# Patient Record
Sex: Female | Born: 1995
Health system: Southern US, Community
[De-identification: ages and names within clinical notes are randomized; demographics above are authoritative.]

## PROBLEM LIST (undated history)

## (undated) ENCOUNTER — Inpatient Hospital Stay (HOSPITAL_COMMUNITY): Payer: Self-pay

## (undated) DIAGNOSIS — R569 Unspecified convulsions: Secondary | ICD-10-CM

## (undated) DIAGNOSIS — Z973 Presence of spectacles and contact lenses: Secondary | ICD-10-CM

---
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
## 2013-03-06 ENCOUNTER — Encounter (HOSPITAL_BASED_OUTPATIENT_CLINIC_OR_DEPARTMENT_OTHER): Payer: Self-pay | Admitting: Emergency Medicine

## 2013-03-06 ENCOUNTER — Emergency Department (HOSPITAL_BASED_OUTPATIENT_CLINIC_OR_DEPARTMENT_OTHER)
Admission: EM | Admit: 2013-03-06 | Discharge: 2013-03-06 | Disposition: A | Discharge status: Home / Self Care | Payer: Medicaid Other | Attending: Emergency Medicine | Admitting: Emergency Medicine

## 2013-03-06 ENCOUNTER — Other Ambulatory Visit: Payer: Self-pay

## 2013-03-06 DIAGNOSIS — F410 Panic disorder [episodic paroxysmal anxiety] without agoraphobia: Secondary | ICD-10-CM | POA: Insufficient documentation

## 2013-03-06 DIAGNOSIS — G40909 Epilepsy, unspecified, not intractable, without status epilepticus: Secondary | ICD-10-CM | POA: Insufficient documentation

## 2013-03-06 DIAGNOSIS — Z79899 Other long term (current) drug therapy: Secondary | ICD-10-CM | POA: Insufficient documentation

## 2013-03-06 DIAGNOSIS — Z3202 Encounter for pregnancy test, result negative: Secondary | ICD-10-CM | POA: Insufficient documentation

## 2013-03-06 DIAGNOSIS — R002 Palpitations: Secondary | ICD-10-CM | POA: Insufficient documentation

## 2013-03-06 DIAGNOSIS — Z789 Other specified health status: Secondary | ICD-10-CM | POA: Insufficient documentation

## 2013-03-06 LAB — CBC WITH DIFFERENTIAL/PLATELET
Basophils Absolute: 0 10*3/uL (ref 0.0–0.1)
Eosinophils Relative: 4 % (ref 0–5)
HCT: 40.2 % (ref 36.0–46.0)
Lymphocytes Relative: 38 % (ref 12–46)
MCHC: 33.8 g/dL (ref 30.0–36.0)
MCV: 85.9 fL (ref 78.0–100.0)
Monocytes Absolute: 0.5 10*3/uL (ref 0.1–1.0)
Monocytes Relative: 11 % (ref 3–12)
RDW: 13.1 % (ref 11.5–15.5)
WBC: 4.5 10*3/uL (ref 4.0–10.5)

## 2013-03-06 LAB — BASIC METABOLIC PANEL
BUN: 10 mg/dL (ref 6–23)
CO2: 28 mEq/L (ref 19–32)
Calcium: 9.8 mg/dL (ref 8.4–10.5)
Creatinine, Ser: 0.9 mg/dL (ref 0.50–1.10)

## 2013-03-06 LAB — PREGNANCY, URINE: Preg Test, Ur: NEGATIVE

## 2013-03-06 NOTE — ED Notes (Signed)
Pt reports awoke form sleep and could feel heart pounding in chest and was unable to move extremities or speak  Denies LOC and was aware of events and surrounds at all times

## 2013-03-06 NOTE — ED Provider Notes (Signed)
History     CSN: [id number]  Arrival date & time 03/06/13  0413   First MD Initiated Contact with Patient 03/06/13 [phone]      Chief Complaint  Patient presents with  . Palpitations  . Panic Attack    (Consider location/radiation/quality/duration/timing/severity/associated sxs/prior treatment) HPI Comments: Patient presents with palpitations. She states that she woke up this evening with a feeling that her was racing. She states it lasted a few minutes and then resolve. She had no dizziness shortness of breath or chest pain associated with the palpitations. She states this has happened one time in the past. She denies any increased fatigue or exertional shortness of breath or chest pain. She denies any history of heart problems in the past. She denies a known history of panic attacks. She denies any drug use. She denies any leg swelling or calf tenderness.  Patient is a 17 y.o. female presenting with palpitations.  Palpitations Associated symptoms: no back pain, no chest pain, no cough, no diaphoresis, no dizziness, no nausea, no numbness, no shortness of breath and no vomiting     Past Medical History  Diagnosis Date  . Seizure   . Wears glasses     History reviewed. No pertinent past surgical history.  History reviewed. No pertinent family history.  History  Substance Use Topics  . Smoking status: Never Smoker   . Smokeless tobacco: Never Used  . Alcohol Use: No    OB History   Grav Para Term Preterm Abortions TAB SAB Ect Mult Living                  Review of Systems  Constitutional: Negative for fever, chills, diaphoresis and fatigue.  HENT: Negative for congestion, rhinorrhea and sneezing.   Eyes: Negative.   Respiratory: Negative for cough, chest tightness and shortness of breath.   Cardiovascular: Positive for palpitations. Negative for chest pain and leg swelling.  Gastrointestinal: Negative for nausea, vomiting, abdominal pain, diarrhea and blood in stool.   Genitourinary: Negative for frequency, hematuria, flank pain and difficulty urinating.  Musculoskeletal: Negative for back pain and arthralgias.  Skin: Negative for rash.  Neurological: Negative for dizziness, speech difficulty, weakness, numbness and headaches.    Allergies  Review of patient's allergies indicates no known allergies.  Home Medications   Current Outpatient Rx  Name  Route  Sig  Dispense  Refill  . LamoTRIgine (LAMICTAL XR) 200 MG TB24   Oral   Take 1 tablet by mouth daily.           . LamoTRIgine (LAMICTAL XR) 50 MG TB24   Oral   Take 1 tablet by mouth daily.           Marland Kitchen lamoTRIgine (LAMICTAL) 200 MG tablet   Oral   Take 250 mg by mouth daily.             BP 134/81  Pulse 70  Temp(Src) 98.2 F (36.8 C) (Oral)  Resp 16  SpO2 100%  Physical Exam  Constitutional: She is oriented to person, place, and time. She appears well-developed and well-nourished.  HENT:  Head: Normocephalic and atraumatic.  Eyes: Pupils are equal, round, and reactive to light.  Neck: Normal range of motion. Neck supple.  Cardiovascular: Normal rate, regular rhythm and normal heart sounds.   Pulmonary/Chest: Effort normal and breath sounds normal. No respiratory distress. She has no wheezes. She has no rales. She exhibits no tenderness.  Abdominal: Soft. Bowel sounds are normal. There is no  tenderness. There is no rebound and no guarding.  Musculoskeletal: Normal range of motion. She exhibits no edema.  Negative Homans sign  Lymphadenopathy:    She has no cervical adenopathy.  Neurological: She is alert and oriented to person, place, and time.  Skin: Skin is warm and dry. No rash noted.  Psychiatric: She has a normal mood and affect.    ED Course  Procedures (including critical care time)  Results for orders placed during the hospital encounter of 03/06/13  CBC WITH DIFFERENTIAL      Result Value Range   WBC 4.5  4.0 - 10.5 K/uL   RBC 4.68  3.87 - 5.11 MIL/uL    Hemoglobin 13.6  12.0 - 15.0 g/dL   HCT 40.9  81.1 - 91.4 %   MCV 85.9  78.0 - 100.0 fL   MCH 29.1  26.0 - 34.0 pg   MCHC 33.8  30.0 - 36.0 g/dL   RDW 78.2  95.6 - 21.3 %   Platelets 294  150 - 400 K/uL   Neutrophils Relative % 46  43 - 77 %   Neutro Abs 2.1  1.7 - 7.7 K/uL   Lymphocytes Relative 38  12 - 46 %   Lymphs Abs 1.7  0.7 - 4.0 K/uL   Monocytes Relative 11  3 - 12 %   Monocytes Absolute 0.5  0.1 - 1.0 K/uL   Eosinophils Relative 4  0 - 5 %   Eosinophils Absolute 0.2  0.0 - 0.7 K/uL   Basophils Relative 0  0 - 1 %   Basophils Absolute 0.0  0.0 - 0.1 K/uL  BASIC METABOLIC PANEL      Result Value Range   Sodium 139  135 - 145 mEq/L   Potassium 4.1  3.5 - 5.1 mEq/L   Chloride 102  96 - 112 mEq/L   CO2 28  19 - 32 mEq/L   Glucose, Bld 110 (*) 70 - 99 mg/dL   BUN 10  6 - 23 mg/dL   Creatinine, Ser 0.86  0.50 - 1.10 mg/dL   Calcium 9.8  8.4 - 57.8 mg/dL   GFR calc non Af Amer >90  >90 mL/min   GFR calc Af Amer >90  >90 mL/min  PREGNANCY, URINE      Result Value Range   Preg Test, Ur NEGATIVE  NEGATIVE   No results found.    Date: 03/06/2013  Rate: 69  Rhythm: normal sinus rhythm  QRS Axis: normal  Intervals: normal  ST/T Wave abnormalities: normal  Conduction Disutrbances:none  Narrative Interpretation:   Old EKG Reviewed: none available   1. Palpitations       MDM  Patient presents with palpitations. She denies any other associated symptoms. She has no evidence of anemia or significant electrolyte abnormality. She was discharged home in good condition with her mother. She was advised to followup with her primary care physician for recheck and if symptoms continue she may need a Holter monitor. She was advised to return here for symptoms worsen.        Rolan Bucco, MD 03/06/13 [phone]

## 2016-03-10 ENCOUNTER — Encounter (HOSPITAL_BASED_OUTPATIENT_CLINIC_OR_DEPARTMENT_OTHER): Payer: Self-pay | Admitting: *Deleted

## 2016-03-10 ENCOUNTER — Emergency Department (HOSPITAL_BASED_OUTPATIENT_CLINIC_OR_DEPARTMENT_OTHER)
Admission: EM | Admit: 2016-03-10 | Discharge: 2016-03-10 | Disposition: A | Discharge status: Home / Self Care | Payer: Medicaid Other | Attending: Emergency Medicine | Admitting: Emergency Medicine

## 2016-03-10 DIAGNOSIS — Z79899 Other long term (current) drug therapy: Secondary | ICD-10-CM | POA: Insufficient documentation

## 2016-03-10 DIAGNOSIS — H109 Unspecified conjunctivitis: Secondary | ICD-10-CM

## 2016-03-10 MED ORDER — FLUORESCEIN SODIUM 1 MG OP STRP
1.0000 | ORAL_STRIP | Freq: Once | OPHTHALMIC | Status: AC
  Administered 2016-03-10: 1 via OPHTHALMIC
  Filled 2016-03-10: qty 1

## 2016-03-10 MED ORDER — ERYTHROMYCIN 5 MG/GM OP OINT
1.0000 | TOPICAL_OINTMENT | Freq: Every day | OPHTHALMIC | Status: DC
Start: 2016-03-10 — End: 2016-12-22

## 2016-03-10 MED ORDER — TRIAMCINOLONE ACETONIDE 0.5 % EX CREA: 1.0000 "application " | TOPICAL_CREAM | Freq: Two times a day (BID) | CUTANEOUS | Status: DC

## 2016-03-10 MED ORDER — OLOPATADINE HCL 0.2 % OP SOLN: 1.0000 [drp] | Freq: Every day | OPHTHALMIC | Status: DC

## 2016-03-10 MED FILL — ERYTHROMYCIN EYE OINTMENT: 5 | 5 days supply | Qty: 4 | Fill #0

## 2016-03-10 MED FILL — OLOPATADINE HCL 0.1% EYE DR: 0.1 | 30 days supply | Qty: 5 | Fill #0

## 2016-03-10 NOTE — ED Notes (Signed)
C/o left eye redness, itching since 1 week ago. Feels like it is swollen.

## 2016-03-10 NOTE — ED Provider Notes (Signed)
CSN: [id number]     Arrival date & time 03/10/16  3086 History   First MD Initiated Contact with Patient 03/10/16 0729     No chief complaint on file.  HPI Taylor Colon is a 20 y.o. female  presenting with left eye redness. Patient states this started 1 week ago and has not seemed to get better. She denies any pain, even with movement of the eye, and says it is primarily itchy. There is some discharge and her eye is shut in the morning, but doesn't seem to have a lot of discharge throughout the day. The eye is primarily itchy. She occasionally gets a feeling of something in her eye. She denies any blurry vision. She has not used any drops in the eye, does not wear glasses or contacts. She denies any prior issue with the eye. Denies fever, current headache but gets headaches frequently, no nasal congestion, ear pain, sore throat, trouble swallowing, shortness of breath, chest pain, nausea vomiting, diarrhea constipation. She also complains of some left armpit rash that she has ahd in the past and went to dermatologist, has had a biopsy which didn't appear consistent with any specific diagnosis but did improve/resolve with a steroid cream. No family history of glaucoma or eye disease.  (Consider location/radiation/quality/duration/timing/severity/associated sxs/prior Treatment) Patient is a 20 y.o. female presenting with eye problem. The history is provided by the patient.  Eye Problem Location:  L eye Quality:  Foreign body sensation Severity:  Moderate Onset quality:  Gradual Duration:  7 days Timing:  Constant Progression:  Unchanged Chronicity:  New Context: not chemical exposure, not contact lens problem, not direct trauma, not foreign body and not scratch   Relieved by:  None tried Worsened by:  Nothing tried Ineffective treatments:  None tried Associated symptoms: crusting, discharge, foreign body sensation, headaches, itching, redness, swelling and tearing   Associated symptoms: no  blurred vision, no decreased vision, no double vision, no facial rash, no nausea, no photophobia and no vomiting     Past Medical History  Diagnosis Date  . Seizure (HCC)   . Wears glasses    History reviewed. No pertinent past surgical history. No family history on file. Social History  Substance Use Topics  . Smoking status: Never Smoker   . Smokeless tobacco: Never Used  . Alcohol Use: No   OB History    No data available     Review of Systems  Constitutional: Negative for fever.  HENT: Negative for ear discharge and ear pain.   Eyes: Positive for discharge, redness and itching. Negative for blurred vision, double vision, photophobia and pain.  Respiratory: Negative for shortness of breath.   Cardiovascular: Negative for chest pain.  Gastrointestinal: Negative for nausea, vomiting, diarrhea and constipation.  Genitourinary: Negative for urgency and frequency.  Musculoskeletal: Negative for joint swelling.  Neurological: Positive for headaches. Negative for seizures (last was 2-3 years ago).  All other systems reviewed and are negative.     Allergies  Review of patient's allergies indicates no known allergies.  Home Medications   Prior to Admission medications   Medication Sig Start Date End Date Taking? Authorizing Provider  LamoTRIgine (LAMICTAL XR) 200 MG TB24 Take 1 tablet by mouth daily. 300 mg   Yes Historical Provider, MD   BP 136/85 mmHg  Pulse 81  Temp(Src) 98.5 F (36.9 C) (Oral)  Resp 18  Ht  (1.575 m)  Wt 113.399 kg  BMI 45.71 kg/m2  SpO2 99%  LMP 02/08/2016 Physical  Exam  Constitutional: She is oriented to person, place, and time. She appears well-developed and well-nourished. No distress.  HENT:  Head: Normocephalic and atraumatic.  Eyes: EOM are normal. Pupils are equal, round, and reactive to light. Left eye exhibits discharge. Left eye exhibits no hordeolum. No foreign body present in the left eye. Right conjunctiva is not injected.  Right conjunctiva has no hemorrhage. Left conjunctiva is injected. Left conjunctiva has no hemorrhage.  Slit lamp exam:      The left eye shows no fluorescein uptake.  Limited/nondilated bedside fundoscopic exam is normal. No obvious hyphema or hypopyon.  Neck: Normal range of motion.  Cardiovascular: Normal rate, regular rhythm, normal heart sounds and intact distal pulses.   Pulmonary/Chest: Effort normal and breath sounds normal.  Lymphadenopathy:    She has no cervical adenopathy.  Neurological: She is alert and oriented to person, place, and time.  Skin: Skin is warm and dry.     Nursing note and vitals reviewed.   ED Course  Procedures (including critical care time) Labs Review Labs Reviewed - No data to display  Imaging Review No results found. I have personally reviewed and evaluated these images and lab results as part of my medical decision-making.   EKG Interpretation None      MDM   Final diagnoses:  Conjunctivitis of left eye   Fluorescein negative for uptake, no foreign objects seen. Most likely bacterial vs viral vs allergic conjunctivitis. Treat with erythromycin ointment and pataday drops, return if vision changes or eye becomes painful. For armpit rash rx triamcinolone cream and follow up with PCP/dermatologist if not resolving.   Nani RavensAndrew M Willies Laviolette, MD 03/10/16 [phone]  Melene Planan Floyd, DO 03/11/16 1118

## 2016-03-10 NOTE — Discharge Instructions (Signed)
Your eye has conjunctivitis, which could be due to a virus, bacteria, or allergies. There was no foreign body seen on the exam today, the feeling of something in your eye is most likely because the eye is irritated and dry. You can use over the counter lubricating drops as much as you want (good brands are Refresh and Systane).   Use the erythromycin ointment at night time right before bed (it will make your vision blurry for a short period so going to bed right after is best). You can use the pataday eye drop once a day to help with itching as needed. Don't use the pataday drops for more than a week   How to Use Eye Drops and Eye Ointments HOW TO APPLY EYE DROPS Follow these steps when applying eye drops: 1. Wash your hands. 2. Tilt your head back. 3. Put a finger under your eye and use it to gently pull your lower lid downward. Keep that finger in place. 4. Using your other hand, hold the dropper between your thumb and index finger. 5. Position the dropper just over the edge of the lower lid. Hold it as close to your eye as you can without touching the dropper to your eye. 6. Steady your hand. One way to do this is to lean your index finger against your brow. 7. Look up. 8. Slowly and gently squeeze one drop of medicine into your eye. 9. Close your eye. 10. Place a finger between your lower eyelid and your nose. Press gently for 2 minutes. This increases the amount of time that the medicine is exposed to the eye. It also reduces side effects that can develop if the drop gets into the bloodstream through the nose. HOW TO APPLY EYE OINTMENTS Follow these steps when applying eye ointments: 1. Wash your hands. 2. Put a finger under your eye and use it to gently pull your lower lid downward. Keep that finger in place. 3. Using your other hand, place the tip of the tube between your thumb and index finger with the remaining fingers braced against your cheek or nose. 4. Hold the tube just over the  edge of your lower lid without touching the tube to your lid or eyeball. 5. Look up. 6. Line the inner part of your lower lid with ointment. 7. Gently pull up on your upper lid and look down. This will force the ointment to spread over the surface of the eye. 8. Release the upper lid. 9. If you can, close your eyes for 1-2 minutes. Do not rub your eyes. If you applied the ointment correctly, your vision will be blurry for a few minutes. This is normal. ADDITIONAL INFORMATION  Make sure to use the eye drops or ointment as told by your health care provider.  If you have been told to use both eye drops and an eye ointment, apply the eye drops first, then wait 3-4 minutes before you apply the ointment.  Try not to touch the tip of the dropper or tube to your eye. A dropper or tube that has touched the eye can become contaminated.   This information is not intended to replace advice given to you by your health care provider. Make sure you discuss any questions you have with your health care provider.   Document Released: 12/27/2000 Document Revised: 02/04/2015 Document Reviewed: 09/16/2014 Elsevier Interactive Patient Education [employer]2016 [employer] Inc.

## 2016-05-12 DIAGNOSIS — G43009 Migraine without aura, not intractable, without status migrainosus: Secondary | ICD-10-CM | POA: Insufficient documentation

## 2016-07-29 ENCOUNTER — Encounter (HOSPITAL_BASED_OUTPATIENT_CLINIC_OR_DEPARTMENT_OTHER): Payer: Self-pay | Admitting: Emergency Medicine

## 2016-07-29 ENCOUNTER — Emergency Department (HOSPITAL_BASED_OUTPATIENT_CLINIC_OR_DEPARTMENT_OTHER): Payer: Self-pay

## 2016-07-29 ENCOUNTER — Emergency Department (HOSPITAL_BASED_OUTPATIENT_CLINIC_OR_DEPARTMENT_OTHER)
Admission: EM | Admit: 2016-07-29 | Discharge: 2016-07-29 | Disposition: A | Discharge status: Home / Self Care | Payer: Self-pay | Attending: Emergency Medicine | Admitting: Emergency Medicine

## 2016-07-29 DIAGNOSIS — L309 Dermatitis, unspecified: Secondary | ICD-10-CM | POA: Insufficient documentation

## 2016-07-29 DIAGNOSIS — H66001 Acute suppurative otitis media without spontaneous rupture of ear drum, right ear: Secondary | ICD-10-CM | POA: Insufficient documentation

## 2016-07-29 MED ORDER — HYDROCODONE-ACETAMINOPHEN 5-325 MG PO TABS: ORAL_TABLET | ORAL | 0 refills | Status: DC

## 2016-07-29 MED ORDER — AMOXICILLIN 500 MG PO CAPS: 1000.0000 mg | ORAL_CAPSULE | Freq: Two times a day (BID) | ORAL | 0 refills | Status: DC

## 2016-07-29 MED ORDER — BENZONATATE 100 MG PO CAPS
200.0000 mg | ORAL_CAPSULE | Freq: Once | ORAL | Status: AC
  Administered 2016-07-29: 200 mg via ORAL
  Filled 2016-07-29: qty 2

## 2016-07-29 MED FILL — AMOXICILLIN 500 MG CAPSULE: 500 | 10 days supply | Qty: 40 | Fill #0

## 2016-07-29 MED FILL — HYDROCODON-APAP 5-325: 5-325 | 1 days supply | Qty: 7 | Fill #0

## 2016-07-29 NOTE — ED Provider Notes (Signed)
[facility]   CSN: [id number] Arrival date & time: 07/29/16  0901     History   Chief Complaint Chief Complaint  Patient presents with  . Cough    HPI   Blood pressure 125/74, pulse 90, temperature 98.3 F (36.8 C), temperature source Oral, resp. rate 18, height 5\' 2"  (1.575 m), weight 113.4 kg, last menstrual period 07/04/2016, SpO2 98 %.  Taylor Colon is a 20 y.o. female complaining of Rhinorrhea, productive cough and right ear pain worsening over the course of 3 days. Patient denies chest pain, shortness of breath, fever, chills, headache, myalgia, sinus pressure pain, abdominal pain, nausea vomiting, change in bowel or bladder habits. She also notes an itchy rash which she's had for months, she's been using Aveeno over-the-counter with little relief.   Past Medical History:  Diagnosis Date  . Seizure (HCC)   . Wears glasses     There are no active problems to display for this patient.   History reviewed. No pertinent surgical history.  OB History    No data available       Home Medications    Prior to Admission medications   Medication Sig Start Date End Date Taking? Authorizing Provider  erythromycin [facility](ROMYCIN) ophthalmic ointment Place 1 application into the left eye at bedtime. Stop 2 days after red eye resolves 03/10/16   Nani RavensAndrew M Wight, MD  LamoTRIgine (LAMICTAL XR) 200 MG TB24 Take 1 tablet by mouth daily. 300 mg    Historical Provider, MD  Olopatadine HCl 0.2 % SOLN Apply 1 drop to eye daily. Stop after 2 weeks 03/10/16   Nani RavensAndrew M Wight, MD  triamcinolone cream (KENALOG) 0.5 % Apply 1 application topically 2 (two) times daily. 03/10/16   Nani RavensAndrew M Wight, MD    Family History History reviewed. No pertinent family history.  Social History Social History  Substance Use Topics  . Smoking status: Never Smoker  . Smokeless tobacco: Never Used  . Alcohol use No     Allergies   Review of patient's allergies indicates no known  allergies.   Review of Systems Review of Systems  10 systems reviewed and found to be negative, except as noted in the HPI.   Physical Exam Updated Vital Signs BP 125/74 (BP Location: Left Arm)   Pulse 90   Temp 98.3 F (36.8 C) (Oral)   Resp 18   Ht 5\' 2"  (1.575 m)   Wt 113.4 kg   LMP 07/04/2016   SpO2 98%   BMI 45.73 kg/m   Physical Exam  Constitutional: She appears well-developed and well-nourished.  HENT:  Head: Normocephalic.  Right Ear: External ear normal.  Left Ear: External ear normal.  Mouth/Throat: Oropharynx is clear and moist. No oropharyngeal exudate.  No drooling or stridor. Posterior pharynx mildly erythematous no significant tonsillar hypertrophy. No exudate. Soft palate rises symmetrically. No TTP or induration under tongue.   No tenderness to palpation of frontal or bilateral maxillary sinuses.  Mild mucosal edema in the nares with scant rhinorrhea.  Right tympanic membrane is erythematous and bulging with no light reflex.    Eyes: Conjunctivae and EOM are normal. Pupils are equal, round, and reactive to light.  Neck: Normal range of motion. Neck supple.  Cardiovascular: Normal rate and regular rhythm.   Pulmonary/Chest: Effort normal and breath sounds normal. No stridor. No respiratory distress. She has no wheezes. She has no rales. She exhibits no tenderness.  Abdominal: Soft. There is no tenderness. There is no  rebound and no guarding.  Skin: Rash noted.  Scaled ulcerations to bilateral elbows and both knees, lesions are blanchable, the spare the palms soles, mucous membranes.  Nursing note and vitals reviewed.    ED Treatments / Results  Labs (all labs ordered are listed, but only abnormal results are displayed) Labs Reviewed - No data to display  EKG  EKG Interpretation None       Radiology Dg Chest 2 View  Result Date: 07/29/2016 CLINICAL DATA:  Cough EXAM: CHEST  2 VIEW COMPARISON:  None. FINDINGS: The heart size and  mediastinal contours are within normal limits. Both lungs are clear. The visualized skeletal structures are unremarkable. IMPRESSION: No active cardiopulmonary disease. Electronically Signed   By: Marlan Palau M.D.   On: 07/29/2016 09:42    Procedures Procedures (including critical care time)  Medications Ordered in ED Medications  benzonatate (TESSALON) capsule 200 mg (not administered)     Initial Impression / Assessment and Plan / ED Course  I have reviewed the triage vital signs and the nursing notes.  Pertinent labs & imaging results that were available during my care of the patient were reviewed by me and considered in my medical decision making (see chart for details).  Clinical Course    Vitals:   07/29/16 0906  BP: 125/74  Pulse: 90  Resp: 18  Temp: 98.3 F (36.8 C)  TempSrc: Oral  SpO2: 98%  Weight: 113.4 kg  Height: 5\' 2"  (1.575 m)    Medications  benzonatate (TESSALON) capsule 200 mg (not administered)    Taylor Colon is 20 y.o. female presenting with URI-like symptoms including rhinorrhea, right ear pain, productive cough. Patient afebrile overall very well appearing. Lung sounds clear to auscultation bilaterally. Right TM erythematous and bulging, given her productive cough we'll obtain chest x-ray to evaluate for pneumonia.  Chest x-ray negative, patient would also like a persistent pruritic rash to extensor surfaces evaluated, lesions consistent with eczema, patient advised to apply Aquaphor to the areas multiple times a day and after showering. I have given her referral for primary care. Amoxicillin for otitis media, Vicodin for cough at night. Work note provided.  Evaluation does not show pathology that would require ongoing emergent intervention or inpatient treatment. Pt is hemodynamically stable and mentating appropriately. Discussed findings and plan with patient/guardian, who agrees with care plan. All questions answered. Return precautions discussed  and outpatient follow up given.      Final Clinical Impressions(s) / ED Diagnoses   Final diagnoses:  None    New Prescriptions New Prescriptions   No medications on file     Wynetta Emery, PA-C 07/29/16 1011    Maia Plan, MD 07/29/16 1038

## 2016-07-29 NOTE — Discharge Instructions (Signed)
Please follow with your primary care doctor in the next 2 days for a check-up. They must obtain records for further management.  ° °Do not hesitate to return to the Emergency Department for any new, worsening or concerning symptoms.  ° ° °Take your antibiotics as directed and to completion. You should never have any leftover antibiotics! Push fluids and stay well hydrated.  ° °Any antibiotic use can reduce the efficacy of hormonal birth control. Please use back up method of contraception.  ° ° °

## 2016-07-29 NOTE — ED Triage Notes (Signed)
Patient reports a cough x 3 days with right ear pain

## 2016-08-16 ENCOUNTER — Emergency Department (HOSPITAL_BASED_OUTPATIENT_CLINIC_OR_DEPARTMENT_OTHER)
Admission: EM | Admit: 2016-08-16 | Discharge: 2016-08-16 | Disposition: A | Discharge status: Home / Self Care | Payer: Medicaid Other | Attending: Emergency Medicine | Admitting: Emergency Medicine

## 2016-08-16 ENCOUNTER — Encounter (HOSPITAL_BASED_OUTPATIENT_CLINIC_OR_DEPARTMENT_OTHER): Payer: Self-pay | Admitting: Emergency Medicine

## 2016-08-16 DIAGNOSIS — K0889 Other specified disorders of teeth and supporting structures: Secondary | ICD-10-CM | POA: Insufficient documentation

## 2016-08-16 MED ORDER — IBUPROFEN 600 MG PO TABS: 600.0000 mg | ORAL_TABLET | Freq: Four times a day (QID) | ORAL | 0 refills | Status: DC | PRN

## 2016-08-16 MED ORDER — PENICILLIN V POTASSIUM 500 MG PO TABS: 500.0000 mg | ORAL_TABLET | Freq: Four times a day (QID) | ORAL | 0 refills | Status: AC

## 2016-08-16 MED FILL — IBUPROFEN 600 MG TABLET: 600 | 7 days supply | Qty: 30 | Fill #0

## 2016-08-16 MED FILL — PENICILLIN VK 500 MG TABLET: 500 | 7 days supply | Qty: 28 | Fill #0

## 2016-08-16 NOTE — Discharge Instructions (Signed)
You have a dental injury. It is very important that you get evaluated by a dentist as soon as possible. Call tomorrow to schedule an appointment. Ibuprofen as needed for pain. Take your full course of antibiotics. Read the instructions below. ° °Eat a soft or liquid diet and rinse your mouth out after meals with warm water. You should see a dentist or return here at once if you have increased swelling, increased pain or uncontrolled bleeding from the site of your injury. ° °SEEK MEDICAL CARE IF:  °You have increased pain not controlled with medicines.  °You have swelling around your tooth, in your face or neck.  °You have bleeding which starts, continues, or gets worse.  °You have a fever >101 °If you are unable to open your mouth °

## 2016-08-16 NOTE — ED Triage Notes (Signed)
Patient reports that she is having pain to the right side of her mouth. More painful after having her cavity filled.

## 2016-08-16 NOTE — ED Provider Notes (Signed)
[facility]   CSN: [id number] Arrival date & time: 08/16/16  1346     History   Chief Complaint Chief Complaint  Patient presents with  . Dental Pain    HPI Taylor Colon is a 20 y.o. female.  The history is provided by the patient and medical records. No language interpreter was used.  Dental Pain     Taylor Colon is a 20 y.o. female  with a PMH of seizures who presents to the Emergency Department complaining of worsening right lower dental pain. Patient states that she had a cavity filled 2 months ago and has been having persistent pain since that time. This pain acutely worsened over the last week. She has tried Orajel and ibuprofen with little relief. She denies difficulty swallowing, difficulty breathing, fevers, neck pain.  Past Medical History:  Diagnosis Date  . Seizure (HCC)   . Wears glasses     There are no active problems to display for this patient.   History reviewed. No pertinent surgical history.  OB History    No data available       Home Medications    Prior to Admission medications   Medication Sig Start Date End Date Taking? Authorizing Provider  amoxicillin (AMOXIL) 500 MG capsule Take 2 capsules (1,000 mg total) by mouth 2 (two) times daily. 07/29/16   Nicole Pisciotta, PA-C  erythromycin [facility](ROMYCIN) ophthalmic ointment Place 1 application into the left eye at bedtime. Stop 2 days after red eye resolves 03/10/16   Nani RavensAndrew M Wight, MD  HYDROcodone-acetaminophen (NORCO/VICODIN) 5-325 MG tablet Take 1-2 tablets by mouth every 6 hours as needed for pain and/or cough. 07/29/16   Nicole Pisciotta, PA-C  ibuprofen (ADVIL,MOTRIN) 600 MG tablet Take 1 tablet (600 mg total) by mouth every 6 (six) hours as needed. 08/16/16   Amos Gaber Pilcher Mazin Emma, PA-C  LamoTRIgine (LAMICTAL XR) 200 MG TB24 Take 1 tablet by mouth daily. 300 mg    Historical Provider, MD  Olopatadine HCl 0.2 % SOLN Apply 1 drop to eye daily. Stop after 2 weeks 03/10/16    Nani RavensAndrew M Wight, MD  penicillin v potassium (VEETID) 500 MG tablet Take 1 tablet (500 mg total) by mouth 4 (four) times daily. 08/16/16 08/23/16  Chase PicketJaime Pilcher Kameka Whan, PA-C  triamcinolone cream (KENALOG) 0.5 % Apply 1 application topically 2 (two) times daily. 03/10/16   Nani RavensAndrew M Wight, MD    Family History History reviewed. No pertinent family history.  Social History Social History  Substance Use Topics  . Smoking status: Never Smoker  . Smokeless tobacco: Never Used  . Alcohol use No     Allergies   Patient has no known allergies.   Review of Systems Review of Systems  Constitutional: Negative for fever.  HENT: Positive for dental problem. Negative for trouble swallowing.   Respiratory: Negative for shortness of breath.   Musculoskeletal: Negative for neck pain.     Physical Exam Updated Vital Signs BP 129/73 (BP Location: Left Arm)   Pulse 86   Temp 98.1 F (36.7 C) (Oral)   Resp 18   Ht 5\' 2"  (1.575 m)   Wt 99.8 kg   LMP 07/04/2016   SpO2 98%   BMI 40.24 kg/m   Physical Exam  Constitutional: She is oriented to person, place, and time. She appears well-developed and well-nourished. No distress.  HENT:  Head: Normocephalic and atraumatic.  Mouth/Throat:    Pain along tooth as depicted in image, midline uvula, no trismus, oropharynx  moist and clear, no abscess noted, no oropharyngeal erythema or edema, neck supple and no tenderness. No facial edema  Cardiovascular: Normal rate, regular rhythm and normal heart sounds.   No murmur heard. Pulmonary/Chest: Effort normal and breath sounds normal. No respiratory distress.  Musculoskeletal: Normal range of motion.  Neurological: She is alert and oriented to person, place, and time.  Skin: Skin is warm and dry.  Psychiatric: She has a normal mood and affect.  Nursing note and vitals reviewed.    ED Treatments / Results  Labs (all labs ordered are listed, but only abnormal results are displayed) Labs Reviewed -  No data to display  EKG  EKG Interpretation None       Radiology No results found.  Procedures Procedures (including critical care time)  Medications Ordered in ED Medications - No data to display   Initial Impression / Assessment and Plan / ED Course  I have reviewed the triage vital signs and the nursing notes.  Pertinent labs & imaging results that were available during my care of the patient were reviewed by me and considered in my medical decision making (see chart for details).  Clinical Course    Patient with dentalgia. No abscess requiring immediate incision and drainage. Patient is afebrile, non toxic appearing, and swallowing secretions well. Exam not concerning for Ludwig's angina or pharyngeal abscess. Will treat with PenVK and ibuprogen. Patient is followed by dentistry and I stressed the importance of dental follow up for ultimate management of dental pain. Patient voices understanding and is agreeable to plan.  Final Clinical Impressions(s) / ED Diagnoses   Final diagnoses:  Pain, dental    New Prescriptions New Prescriptions   IBUPROFEN (ADVIL,MOTRIN) 600 MG TABLET    Take 1 tablet (600 mg total) by mouth every 6 (six) hours as needed.   PENICILLIN V POTASSIUM (VEETID) 500 MG TABLET    Take 1 tablet (500 mg total) by mouth 4 (four) times daily.     Chase PicketJaime Pilcher Channa Hazelett, PA-C 08/16/16 1434    Shaune Pollackameron Isaacs, MD 08/16/16 [phone]

## 2016-12-22 ENCOUNTER — Encounter (HOSPITAL_BASED_OUTPATIENT_CLINIC_OR_DEPARTMENT_OTHER): Payer: Self-pay | Admitting: *Deleted

## 2016-12-22 ENCOUNTER — Emergency Department (HOSPITAL_BASED_OUTPATIENT_CLINIC_OR_DEPARTMENT_OTHER)
Admission: EM | Admit: 2016-12-22 | Discharge: 2016-12-22 | Disposition: A | Discharge status: Home / Self Care | Payer: Medicaid Other | Attending: Emergency Medicine | Admitting: Emergency Medicine

## 2016-12-22 DIAGNOSIS — K0889 Other specified disorders of teeth and supporting structures: Secondary | ICD-10-CM | POA: Diagnosis not present

## 2016-12-22 MED ORDER — AMOXICILLIN 500 MG PO CAPS: 500.0000 mg | ORAL_CAPSULE | Freq: Three times a day (TID) | ORAL | 0 refills | Status: DC

## 2016-12-22 MED FILL — AMOXICILLIN 500 MG CAPSULE: 500 | 7 days supply | Qty: 21 | Fill #0

## 2016-12-22 NOTE — ED Triage Notes (Signed)
Pt reports left lower tooth pain x Friday, denies fevers.

## 2016-12-22 NOTE — ED Provider Notes (Signed)
[facility]   CSN: [id number] Arrival date & time: 12/22/16  4098     History   Chief Complaint Chief Complaint  Patient presents with  . Dental Pain    HPI Taylor Colon is a 21 y.o. female.  HPI 21 year old Afro-American female with no significant past medical history presents to the ED today with complaints of toothache. Patient states that her pain started approximately 5 days ago and has gradually worsened. States she knows she has a chipped tooth but has not followed up with a dentist. She has tried over-the-counter Excedrin with little relief. She is also tender warm saltwater gargles with little relief. She denies any fever, chills, nausea, vomiting, facial swelling, difficulty swallowing, difficulty breathing. Past Medical History:  Diagnosis Date  . Seizure (HCC)   . Wears glasses     There are no active problems to display for this patient.   History reviewed. No pertinent surgical history.  OB History    No data available       Home Medications    Prior to Admission medications   Medication Sig Start Date End Date Taking? Authorizing Provider  amoxicillin (AMOXIL) 500 MG capsule Take 1 capsule (500 mg total) by mouth 3 (three) times daily. 12/22/16   Rise Mu, PA-C  LamoTRIgine (LAMICTAL XR) 200 MG TB24 Take 1 tablet by mouth daily. 300 mg    Historical Provider, MD    Family History History reviewed. No pertinent family history.  Social History Social History  Substance Use Topics  . Smoking status: Never Smoker  . Smokeless tobacco: Never Used  . Alcohol use No     Allergies   Patient has no known allergies.   Review of Systems Review of Systems  Constitutional: Negative for chills and fever.  HENT: Positive for dental problem. Negative for trouble swallowing.   Gastrointestinal: Negative for nausea and vomiting.     Physical Exam Updated Vital Signs BP 135/77 (BP Location: Right Arm)   Pulse 87    Temp 98.3 F (36.8 C) (Oral)   Resp 18   Ht 5\' 2"  (1.575 m)   Wt 96.6 kg   LMP 11/25/2016   SpO2 100%   BMI 38.96 kg/m   Physical Exam  Constitutional: She appears well-developed and well-nourished. No distress.  HENT:  Mouth/Throat: Uvula is midline, oropharynx is clear and moist and mucous membranes are normal.    No facial swelling. No sublingual or submandibular swelling. Oropharynx is clear. Managing secretions and maintaining her airway.  Eyes: Right eye exhibits no discharge. Left eye exhibits no discharge. No scleral icterus.  Pulmonary/Chest: No respiratory distress.  Musculoskeletal: Normal range of motion.  Neurological: She is alert.  Skin: Capillary refill takes less than 2 seconds. No pallor.  Nursing note and vitals reviewed.    ED Treatments / Results  Labs (all labs ordered are listed, but only abnormal results are displayed) Labs Reviewed - No data to display  EKG  EKG Interpretation None       Radiology No results found.  Procedures Procedures (including critical care time)  Medications Ordered in ED Medications - No data to display   Initial Impression / Assessment and Plan / ED Course  I have reviewed the triage vital signs and the nursing notes.  Pertinent labs & imaging results that were available during my care of the patient were reviewed by me and considered in my medical decision making (see chart for details).  Patient with toothache.  No gross abscess.  Exam unconcerning for Ludwig's angina or spread of infection.  The patient states there is a possibility that she could be pregnant. Encouraged use of Tylenol for pain. Encouraged avoiding NSAIDs. Will treat with amoxicillin. Discussed with pharmacy.Renae Gloss.  Urged patient to follow-up with dentist.  Dental resource guide given. Patient verbalized understanding the plan of care. All questions answered prior to discharge.   Final Clinical Impressions(s) / ED Diagnoses   Final  diagnoses:  Pain, dental    New Prescriptions New Prescriptions   AMOXICILLIN (AMOXIL) 500 MG CAPSULE    Take 1 capsule (500 mg total) by mouth 3 (three) times daily.     Rise MuKenneth T Lachele Lievanos, PA-C 12/22/16 0920    Geoffery Lyonsouglas Delo, MD 12/22/16 [phone]

## 2016-12-22 NOTE — ED Notes (Signed)
ED Provider at bedside. 

## 2016-12-22 NOTE — Discharge Instructions (Signed)
Please take antibiotics as prescribed. May take Tylenol for pain. Avoid NSAIDs. May apply Orajel over-the-counter to the affected tooth. You need to find dentist follow-up with. Return to the ED if she develops any worsening symptoms.

## 2016-12-31 MED FILL — ACETAMINOPHEN/COD #3 TABLET: 300-30 | 3 days supply | Qty: 20 | Fill #0

## 2016-12-31 MED FILL — AMOXICILLIN 500 MG CAPSULE: 500 | 7 days supply | Qty: 21 | Fill #0

## 2017-01-04 ENCOUNTER — Emergency Department (HOSPITAL_BASED_OUTPATIENT_CLINIC_OR_DEPARTMENT_OTHER)
Admission: EM | Admit: 2017-01-04 | Discharge: 2017-01-05 | Disposition: A | Discharge status: Home / Self Care | Payer: Medicaid Other | Attending: Emergency Medicine | Admitting: Emergency Medicine

## 2017-01-04 ENCOUNTER — Encounter (HOSPITAL_BASED_OUTPATIENT_CLINIC_OR_DEPARTMENT_OTHER): Payer: Self-pay

## 2017-01-04 DIAGNOSIS — Z87891 Personal history of nicotine dependence: Secondary | ICD-10-CM | POA: Insufficient documentation

## 2017-01-04 DIAGNOSIS — O26891 Other specified pregnancy related conditions, first trimester: Secondary | ICD-10-CM | POA: Diagnosis present

## 2017-01-04 DIAGNOSIS — R112 Nausea with vomiting, unspecified: Secondary | ICD-10-CM

## 2017-01-04 DIAGNOSIS — R197 Diarrhea, unspecified: Secondary | ICD-10-CM | POA: Insufficient documentation

## 2017-01-04 DIAGNOSIS — Z79899 Other long term (current) drug therapy: Secondary | ICD-10-CM | POA: Insufficient documentation

## 2017-01-04 DIAGNOSIS — Z3A11 11 weeks gestation of pregnancy: Secondary | ICD-10-CM | POA: Insufficient documentation

## 2017-01-04 DIAGNOSIS — O219 Vomiting of pregnancy, unspecified: Secondary | ICD-10-CM | POA: Diagnosis not present

## 2017-01-04 MED ORDER — SODIUM CHLORIDE 0.9 % IV BOLUS (SEPSIS)
1000.0000 mL | Freq: Once | INTRAVENOUS | Status: AC
  Administered 2017-01-05: 1000 mL via INTRAVENOUS

## 2017-01-04 MED ORDER — LOPERAMIDE HCL 2 MG PO CAPS
4.0000 mg | ORAL_CAPSULE | Freq: Once | ORAL | Status: AC
  Administered 2017-01-05: 4 mg via ORAL
  Filled 2017-01-04: qty 2

## 2017-01-04 MED ORDER — DICYCLOMINE HCL 10 MG/ML IM SOLN
20.0000 mg | Freq: Once | INTRAMUSCULAR | Status: AC
  Administered 2017-01-05: 20 mg via INTRAMUSCULAR
  Filled 2017-01-04: qty 2

## 2017-01-04 MED ORDER — ONDANSETRON HCL 4 MG/2ML IJ SOLN
4.0000 mg | Freq: Once | INTRAMUSCULAR | Status: DC
  Filled 2017-01-04: qty 2

## 2017-01-04 NOTE — ED Provider Notes (Addendum)
By signing my name below, I, Taylor Colon, attest that this documentation has been prepared under the direction and in the presence of Taylor Cassatt N Amsi Grimley, DO . Electronically Signed: Nelwyn Colon, Scribe. 01/04/2017. 11:54 PM.  TIME SEEN: 11:53 PM  CHIEF COMPLAINT: Abdominal Pain  HPI:  Taylor Colon is a 21 y.o. female with no pertinent pmhx who presents to the Emergency Department complaining of constant, mild abdominal pain onset 2 days. She describes her symptoms as a "tightness" localized to her right side. No modifying factors indicated. Pt reports associated vomiting, diarrhea and chills. No medications taken PTA. Denies any fever, dysuria, hematuria, vaginal bleeding or vaginal discharge. Pt is currently taking amoxicillin for an abscessed tooth. No abdominal shx. No sick contacts. No recent hospitalizations. No recent travel. Pt's LNMP was on 11/30/2016.   ROS: See HPI Constitutional: chills, no fever  Eyes: no drainage  ENT: no runny nose   Cardiovascular:  no chest pain  Resp: no SOB  GI: abdominal pain, vomiting. GU: no dysuria, no hematuria, no vaginal bleeding, no vaginal discharge Integumentary: no rash  Allergy: no hives  Musculoskeletal: no leg swelling  Neurological: no slurred speech ROS otherwise negative  PAST MEDICAL HISTORY/PAST SURGICAL HISTORY:  Past Medical History:  Diagnosis Date  . Seizure (HCC)   . Wears glasses     MEDICATIONS:  Prior to Admission medications   Medication Sig Start Date End Date Taking? Authorizing Provider  amoxicillin (AMOXIL) 500 MG capsule Take 1 capsule (500 mg total) by mouth 3 (three) times daily. 12/22/16  Yes Rise Mu, PA-C  LamoTRIgine (LAMICTAL XR) 200 MG TB24 Take 1 tablet by mouth daily. 300 mg    Historical Provider, MD    ALLERGIES:  No Known Allergies  SOCIAL HISTORY:  Social History  Substance Use Topics  . Smoking status: Former Smoker    Quit date: 10/04/2016  . Smokeless tobacco: Never Used  . Alcohol  use 1.8 oz/week    3 Standard drinks or equivalent per week     Comment: only on weekends    FAMILY HISTORY: No family history on file.  EXAM: BP 123/71 (BP Location: Right Arm)   Pulse (!) 106   Temp 98.6 F (37 C) (Oral)   Resp 20   Ht  (1.575 m)   Wt 220 lb (99.8 kg)   LMP 11/30/2016 (Exact Date)   SpO2 98%   BMI 40.24 kg/m  CONSTITUTIONAL: Alert and oriented and responds appropriately to questions. Well-appearing; well-nourished HEAD: Normocephalic EYES: Conjunctivae clear, pupils appear equal, EOMI ENT: normal nose; moist mucous membranes NECK: Supple, no meningismus, no nuchal rigidity, no LAD  CARD: RRR; S1 and S2 appreciated; no murmurs, no clicks, no rubs, no gallops RESP: Normal chest excursion without splinting or tachypnea; breath sounds clear and equal bilaterally; no wheezes, no rhonchi, no rales, no hypoxia or respiratory distress, speaking full sentences ABD/GI: Normal bowel sounds; non-distended; soft, minimally tender throughout right mid and upper abdomen, negative Murphy's sign, no significant tenderness to McBurney's, no rebound, no guarding, no peritoneal signs, no hepatosplenomegaly BACK:  The back appears normal and is non-tender to palpation, there is no CVA tenderness EXT: Normal ROM in all joints; non-tender to palpation; no edema; normal capillary refill; no cyanosis, no calf tenderness or swelling    SKIN: Normal color for age and race; warm; no rash NEURO: Moves all extremities equally PSYCH: The patient's mood and manner are appropriate. Grooming and personal hygiene are appropriate.  MEDICAL DECISION MAKING:  Patient here with nausea, vomiting or diarrhea. She is tender to palpation throughout the right side of her abdomen. Nonspecifically tender though at McBurney's point has a negative Murphy signs of makes appendicitis and cholecystitis less likely. We'll give IV fluids, Zofran, and Imodium for symptomatically relief. We'll obtain labs, urine.  At this time I do not feel she needs emergent imaging but will reassess frequently.  ED PROGRESS: Patient has a positive urine pregnancy test. She reports her last normal period was January 17 which would make her [redacted] weeks pregnant today. She states she did have 2 days of "spotting" on February 27. This was not normal for her to have such a short period.  She has never been pregnant before. Will add on a beta Quant hCG. Again she denies any vaginal bleeding, discharge, dysuria, hematuria. No significant lower abdominal pain. She did not know that she was pregnant. I have canceled patient's IV Zofran and we'll give IV Reglan instead.  2:30 AM  Pt reports feeling much better. Abdominal pain completely resolved. No further nausea, vomiting or diarrhea. Able to drink without difficulty. Labs here unremarkable. HCG is 102,376. Urine shows small ketones and she has received a liter of IV fluids. No other sign of urinary tract infection. I performed a bedside ultrasound which shows an intrauterine pregnancy with a crown-rump length of 3.4 cm and a fetal heart rate of 154 bpm. Normal fetal movement seen. There appears to be a corpus luteum of the right ovary. She has no tenderness over this area. I do not see any yolk sac, fetal pole, gestational sac to suggest that this would be a very rare heterotopic pregnancy. Again doubt appendicitis. Doubt cholecystitis. Negative Murphy sign on exam. Repeat abdominal exam completely benign. I have discussed with her she has any return of pain, vaginal bleeding or feel like she may pass our does pass out she needs to return to [facility] hospital immediately. We'll get outpatient OB/GYN follow-up and start her on prenatal vitamins. We'll discharge with prescription for Reglan for nausea. Have recommended Tylenol as needed for fever and pain. Discussed with her that she should avoid NSAIDs while pregnant.  At this time, I do not feel there is any life-threatening condition present. I  have reviewed and discussed all results (EKG, imaging, lab, urine as appropriate) and exam findings with patient/family. I have reviewed nursing notes and appropriate previous records.  I feel the patient is safe to be discharged home without further emergent workup and can continue workup as an outpatient as needed. Discussed usual and customary return precautions. Patient/family verbalize understanding and are comfortable with this plan.  Outpatient follow-up has been provided if needed. All questions have been answered.   EMERGENCY DEPARTMENT [place] PREGNANCY "Study: Limited Ultrasound of the Pelvis for Pregnancy"  INDICATIONS:Pregnancy(required) and Abdominal or pelvic pain Multiple views of the uterus and pelvic cavity were obtained in real-time with a multi-frequency probe.  APPROACH:Transabdominal  PERFORMED BY: Myself IMAGES ARCHIVED?: Yes LIMITATIONS: Body habitus PREGNANCY FREE FLUID: None ADNEXAL FINDINGS: Normal-appearing left ovary, likely corpus luteum of the right ovary Crown-rump length is 3.4 cm. Gestational age by dates is 11 weeks. FETAL HEART RATE: 154 INTERPRETATION: Intrauterine pregnancy with right-sided corpus luteum      I personally performed the services described in this documentation, which was scribed in my presence. The recorded information has been reviewed and is accurate.       Layla Maw Lianne Carreto, DO 01/05/17 [phone]

## 2017-01-04 NOTE — ED Triage Notes (Signed)
Pt states having RLQ abdominal pain with diarrhea and vomiting since last night.

## 2017-01-05 LAB — PREGNANCY, URINE: PREG TEST UR: POSITIVE — AB

## 2017-01-05 LAB — COMPREHENSIVE METABOLIC PANEL
ALBUMIN: 4 g/dL (ref 3.5–5.0)
ALT: 13 U/L — AB (ref 14–54)
AST: 15 U/L (ref 15–41)
Alkaline Phosphatase: 42 U/L (ref 38–126)
Anion gap: 8 (ref 5–15)
BUN: 9 mg/dL (ref 6–20)
CHLORIDE: 103 mmol/L (ref 101–111)
CO2: 23 mmol/L (ref 22–32)
Calcium: 9.2 mg/dL (ref 8.9–10.3)
Creatinine, Ser: 0.81 mg/dL (ref 0.44–1.00)
GFR calc Af Amer: >60 mL/min (ref >60)
GLUCOSE: 103 mg/dL — AB (ref 65–99)
POTASSIUM: 3.3 mmol/L — AB (ref 3.5–5.1)
SODIUM: 134 mmol/L — AB (ref 135–145)
Total Bilirubin: 0.3 mg/dL (ref 0.3–1.2)
Total Protein: 7.5 g/dL (ref 6.5–8.1)

## 2017-01-05 LAB — CBC WITH DIFFERENTIAL/PLATELET
BASOS ABS: 0 10*3/uL (ref 0.0–0.1)
BASOS PCT: 0 %
EOS ABS: 0.2 10*3/uL (ref 0.0–0.7)
EOS PCT: 3 %
HCT: 39.5 % (ref 36.0–46.0)
Hemoglobin: 13.8 g/dL (ref 12.0–15.0)
Lymphocytes Relative: 29 %
Lymphs Abs: 2 10*3/uL (ref 0.7–4.0)
MCH: 30.1 pg (ref 26.0–34.0)
MCHC: 34.9 g/dL (ref 30.0–36.0)
MCV: 86.2 fL (ref 78.0–100.0)
MONO ABS: 0.6 10*3/uL (ref 0.1–1.0)
Monocytes Relative: 9 %
Neutro Abs: 4 10*3/uL (ref 1.7–7.7)
Neutrophils Relative %: 59 %
PLATELETS: 310 10*3/uL (ref 150–400)
RBC: 4.58 MIL/uL (ref 3.87–5.11)
RDW: 13.2 % (ref 11.5–15.5)
WBC: 6.9 10*3/uL (ref 4.0–10.5)

## 2017-01-05 LAB — HCG, QUANTITATIVE, PREGNANCY: hCG, Beta Chain, Quant, S: 102376 m[IU]/mL — ABNORMAL HIGH (ref <5)

## 2017-01-05 LAB — URINALYSIS, ROUTINE W REFLEX MICROSCOPIC
Glucose, UA: NEGATIVE mg/dL
HGB URINE DIPSTICK: NEGATIVE
Ketones, ur: 15 mg/dL — AB
Leukocytes, UA: NEGATIVE
NITRITE: NEGATIVE
PROTEIN: NEGATIVE mg/dL
Specific Gravity, Urine: 1.031 — ABNORMAL HIGH (ref 1.005–1.030)
pH: 5.5 (ref 5.0–8.0)

## 2017-01-05 LAB — LIPASE, BLOOD: LIPASE: 14 U/L (ref 11–51)

## 2017-01-05 MED ORDER — METOCLOPRAMIDE HCL 10 MG PO TABS: 10.0000 mg | ORAL_TABLET | Freq: Four times a day (QID) | ORAL | 0 refills | Status: DC | PRN

## 2017-01-05 MED ORDER — METOCLOPRAMIDE HCL 5 MG/ML IJ SOLN
10.0000 mg | Freq: Once | INTRAMUSCULAR | Status: AC
  Administered 2017-01-05: 10 mg via INTRAVENOUS
  Filled 2017-01-05: qty 2

## 2017-01-05 MED ORDER — PRENATAL VITAMIN 27-0.8 MG PO TABS: 1.0000 | ORAL_TABLET | Freq: Every day | ORAL | 3 refills | Status: DC

## 2017-01-05 NOTE — ED Notes (Signed)
Pt presents with N/V/D since 01/03/17. Pt also reports abd cramping and tightness.

## 2017-01-05 NOTE — ED Notes (Signed)
Pt drank 8ozs of water and feel better. Pt denies nausea.

## 2017-01-05 NOTE — Discharge Instructions (Signed)
You may take Tylenol 1000 mg every 6 hours as needed for pain or fever. Please avoid NSAIDs such as ibuprofen, Motrin, Advil, aspirin, Goody powders, Aleve, naproxen as these are not safe in pregnancy. Please make an appointment for an OB/GYN. You likely have a viral gastroenteritis. If you began having lower pelvic pain, vaginal bleeding, feel like you might pass out or you do pass out she need to be seen by an OB/GYN immediately and [facility]  You are approximately [redacted] weeks pregnant based on your last menstrual period of January 17.

## 2017-01-13 ENCOUNTER — Encounter (HOSPITAL_BASED_OUTPATIENT_CLINIC_OR_DEPARTMENT_OTHER): Payer: Self-pay

## 2017-01-13 ENCOUNTER — Emergency Department (HOSPITAL_BASED_OUTPATIENT_CLINIC_OR_DEPARTMENT_OTHER)
Admission: EM | Admit: 2017-01-13 | Discharge: 2017-01-13 | Disposition: A | Discharge status: Home / Self Care | Payer: Medicaid Other | Attending: Dermatology | Admitting: Dermatology

## 2017-01-13 DIAGNOSIS — K0889 Other specified disorders of teeth and supporting structures: Secondary | ICD-10-CM | POA: Insufficient documentation

## 2017-01-13 DIAGNOSIS — Z5321 Procedure and treatment not carried out due to patient leaving prior to being seen by health care provider: Secondary | ICD-10-CM | POA: Insufficient documentation

## 2017-01-13 DIAGNOSIS — Z87891 Personal history of nicotine dependence: Secondary | ICD-10-CM | POA: Diagnosis not present

## 2017-01-13 NOTE — ED Triage Notes (Signed)
c/o right lower toothache-seen here and by dentist-completed abx 2 weeks ago-states has appt with dentist to have tooth pulled tomorrow-NAD-steady gait

## 2017-01-13 NOTE — ED Triage Notes (Signed)
Pt became angry when advised of wait stating she should have gone to her doctor-advised to let staff know if she decides to leave

## 2017-01-13 NOTE — ED Notes (Signed)
Pt seen leaving ED by registration staff.

## 2017-01-24 MED FILL — CITRANATAL HARMONY CAPSULE: 27-1-260 | 30 days supply | Qty: 30 | Fill #0

## 2017-04-11 MED FILL — TERCONAZOLE 80 MG SUPP: 80 | 3 days supply | Qty: 3 | Fill #0

## 2017-06-07 MED FILL — TERCONAZOLE 0.4% VAG CREAM: 0.4 | 7 days supply | Qty: 45 | Fill #0

## 2017-06-17 MED FILL — metroNIDAZOLE 500 MG TABS: 500 | 1 days supply | Qty: 4 | Fill #0

## 2017-06-17 MED FILL — PROMETHAZINE 25 MG TABLET: 25 | 8 days supply | Qty: 30 | Fill #0

## 2017-08-01 DIAGNOSIS — Z98891 History of uterine scar from previous surgery: Secondary | ICD-10-CM | POA: Insufficient documentation

## 2019-11-09 MED FILL — DIETHYLPROPION ER 75 MG TAB: 75 | 30 days supply | Qty: 30 | Fill #0

## 2019-11-20 MED FILL — metFORMIN HCL 500 MG TABS: 500 | 90 days supply | Qty: 180 | Fill #0

## 2019-12-11 MED FILL — DIETHYLPROPION ER 75 MG TAB: 75 | 30 days supply | Qty: 30 | Fill #0

## 2020-01-11 MED FILL — DIETHYLPROPION ER 75 MG TAB: 75 | 30 days supply | Qty: 30 | Fill #0

## 2020-02-18 MED FILL — DIETHYLPROPION ER 75 MG TAB: 75 | 30 days supply | Qty: 30 | Fill #0

## 2020-03-26 MED FILL — DIETHYLPROPION ER 75 MG TAB: 75 | 30 days supply | Qty: 30 | Fill #0

## 2020-04-16 MED FILL — DIETHYLPROPION ER 75 MG TAB: 75 | 30 days supply | Qty: 30 | Fill #0

## 2020-04-24 MED FILL — DIETHYLPROPION ER 75 MG TAB: 75 | 30 days supply | Qty: 30 | Fill #0

## 2020-06-18 MED FILL — DIETHYLPROPION ER 75 MG TAB: 75 | 30 days supply | Qty: 30 | Fill #0

## 2020-09-18 ENCOUNTER — Other Ambulatory Visit: Payer: Self-pay

## 2020-09-18 ENCOUNTER — Encounter (HOSPITAL_BASED_OUTPATIENT_CLINIC_OR_DEPARTMENT_OTHER): Payer: Self-pay

## 2020-09-18 ENCOUNTER — Emergency Department (HOSPITAL_BASED_OUTPATIENT_CLINIC_OR_DEPARTMENT_OTHER)
Admission: EM | Admit: 2020-09-18 | Discharge: 2020-09-18 | Disposition: A | Discharge status: Home / Self Care | Payer: Medicaid Other | Attending: Emergency Medicine | Admitting: Emergency Medicine

## 2020-09-18 ENCOUNTER — Emergency Department (HOSPITAL_BASED_OUTPATIENT_CLINIC_OR_DEPARTMENT_OTHER): Payer: Medicaid Other

## 2020-09-18 DIAGNOSIS — S99911A Unspecified injury of right ankle, initial encounter: Secondary | ICD-10-CM | POA: Diagnosis not present

## 2020-09-18 DIAGNOSIS — F17210 Nicotine dependence, cigarettes, uncomplicated: Secondary | ICD-10-CM | POA: Insufficient documentation

## 2020-09-18 DIAGNOSIS — W108XXA Fall (on) (from) other stairs and steps, initial encounter: Secondary | ICD-10-CM | POA: Diagnosis not present

## 2020-09-18 MED ORDER — IBUPROFEN 400 MG PO TABS
600.0000 mg | ORAL_TABLET | Freq: Once | ORAL | Status: AC
  Administered 2020-09-18: 600 mg via ORAL
  Filled 2020-09-18: qty 1

## 2020-09-18 NOTE — Discharge Instructions (Signed)
You have been seen today for an ankle injury. There were no acute abnormalities on the x-rays, including no sign of fracture or dislocation, however, there could be injuries to the soft tissues, such as the ligaments or tendons that are not seen on xrays. There could also be what are called occult fractures that are small fractures not seen on xray. °Antiinflammatory medications: Take 600 mg of ibuprofen every 6 hours or 440 mg (over the counter dose) to 500 mg (prescription dose) of naproxen every 12 hours for the next 3 days. After this time, these medications may be used as needed for pain. Take these medications with food to avoid upset stomach. Choose only one of these medications, do not take them together. °Acetaminophen (generic for Tylenol): Should you continue to have additional pain while taking the ibuprofen or naproxen, you may add in acetaminophen as needed. Your daily total maximum amount of acetaminophen from all sources should be limited to 4000mg/day for persons without liver problems, or 2000mg/day for those with liver problems. °Ice: May apply ice to the area over the next 24 hours for 15 minutes at a time to reduce swelling. °Elevation: Keep the extremity elevated as often as possible to reduce pain and inflammation. °Support: Wear the cam boot for support and comfort. Wear this until pain resolves. You will be weight-bearing as tolerated, which means you can slowly start to put weight on the extremity and increase amount and frequency as pain allows. °Follow up: If symptoms are improving, you may follow up with your primary care provider for any continued management. If symptoms are not starting to improve within a week, you should follow up with the orthopedic specialist within two weeks. °Return: Return to the ED for numbness, weakness, increasing pain, overall worsening symptoms, loss of function, or if symptoms are not improving, you have tried to follow up with the orthopedic specialist,  and have been unable to do so. ° °For prescription assistance, may try using prescription discount sites or apps, such as goodrx.com or Good Rx smart phone app. °

## 2020-09-18 NOTE — ED Triage Notes (Signed)
Pt reports right ankle injury 2 days ago-NAD-steady gait

## 2020-09-18 NOTE — ED Provider Notes (Signed)
[facility] EMERGENCY DEPARTMENT Provider Note   CSN: [id number] Arrival date & time: 09/18/20  1943     History Chief Complaint  Patient presents with  . Ankle Injury    Taylor Colon is a 24 y.o. female.  HPI      Taylor Colon is a 24 y.o. female, with a history of seizures and obesity, presenting to the ED with right ankle injury that occurred yesterday morning. Patient states she slipped on some stairs and injured her right ankle in the process.  Pain is medial and lateral, throbbing and aching, nonradiating, moderate.  She has not taken any medications for the pain.  She has been ambulatory since the incident.  Denies head injury, neck/back pain, numbness, weakness, other lower extremity pain, or any other complaints or injuries.  Past Medical History:  Diagnosis Date  . Seizure (HCC)   . Wears glasses     There are no problems to display for this patient.   Past Surgical History:  Procedure Laterality Date  . CESAREAN SECTION       OB History    Gravida  1   Para      Term      Preterm      AB      Living        SAB      IAB      Ectopic      Multiple      Live Births              No family history on file.  Social History   Tobacco Use  . Smoking status: Current Every Day Smoker    Types: E-cigarettes  . Smokeless tobacco: Never Used  Vaping Use  . Vaping Use: Every day  Substance Use Topics  . Alcohol use: Yes    Comment: occ  . Drug use: No    Home Medications Prior to Admission medications   Medication Sig Start Date End Date Taking? Authorizing Provider  LamoTRIgine (LAMICTAL XR) 200 MG TB24 Take 1 tablet by mouth daily. 300 mg    [provider]    Allergies    Patient has no known allergies.  Review of Systems   Review of Systems  Musculoskeletal: Positive for arthralgias and joint swelling. Negative for back pain and neck pain.  Neurological: Negative for weakness and numbness.     Physical Exam Updated Vital Signs BP 123/70 (BP Location: Right Arm)   Pulse 95   Temp 99.2 F (37.3 C) (Oral)   Resp 16   Ht 5\' 3"  (1.6 m)   Wt 119.7 kg   LMP 09/04/2020   SpO2 100%   Breastfeeding Unknown   BMI 46.77 kg/m   Physical Exam Vitals and nursing note reviewed.  Constitutional:      General: She is not in acute distress.    Appearance: She is well-developed and well-nourished. She is not diaphoretic.  HENT:     Head: Normocephalic and atraumatic.  Eyes:     Conjunctiva/sclera: Conjunctivae normal.  Cardiovascular:     Rate and Rhythm: Normal rate and regular rhythm.     Pulses:          Dorsalis pedis pulses are 2+ on the right side.       Posterior tibial pulses are 2+ on the right side.  Pulmonary:     Effort: Pulmonary effort is normal.  Musculoskeletal:     Cervical back: Neck supple.  Comments: Tenderness over the medial and lateral malleoli with mild swelling.  No noted deformity, instability.  Skin:    General: Skin is warm and dry.     Coloration: Skin is not pale.  Neurological:     Mental Status: She is alert.     Comments: Sensation to light touch grossly intact in the right foot and toes. Motor function intact in the right ankle and toes.  Psychiatric:        Mood and Affect: Mood and affect normal.        Behavior: Behavior normal.     ED Results / Procedures / Treatments   Labs (all labs ordered are listed, but only abnormal results are displayed) Labs Reviewed - No data to display  EKG None  Radiology DG Ankle Complete Right  Result Date: 09/18/2020 CLINICAL DATA:  Right ankle injury with pain EXAM: RIGHT ANKLE - COMPLETE 3+ VIEW COMPARISON:  None. FINDINGS: No fracture or malalignment. Ankle mortise is symmetric. There is mild soft tissue swelling IMPRESSION: Soft tissue swelling. No acute osseous abnormality. Electronically Signed   By: Jasmine Pang M.D.   On: 09/18/2020 20:14    Procedures Procedures (including  critical care time)  Medications Ordered in ED Medications  ibuprofen (ADVIL) tablet 600 mg (has no administration in time range)    ED Course  I have reviewed the triage vital signs and the nursing notes.  Pertinent labs & imaging results that were available during my care of the patient were reviewed by me and considered in my medical decision making (see chart for details).    MDM Rules/Calculators/A&P                          Patient presents with right ankle injury. I personally reviewed and interpreted the patient's x-ray. No osseous abnormality noted. Placed in a cam boot.  Patient declined crutches. The patient was given instructions for home care as well as return precautions. Patient voices understanding of these instructions, accepts the plan, and is comfortable with discharge.    Final Clinical Impression(s) / ED Diagnoses Final diagnoses:  Injury of right ankle, initial encounter    Rx / DC Orders ED Discharge Orders    None       Concepcion Living 09/18/20 2209    Milagros Loll, MD 09/20/20 [phone]

## 2020-10-08 ENCOUNTER — Telehealth: Payer: Self-pay | Admitting: Family Medicine

## 2020-10-08 NOTE — Telephone Encounter (Signed)
Called pt to offer ED f/u appt w/ Dr.Schmitz for :   Injury of right ankle,    --pt not available left message w/parent.  --glh

## 2021-02-26 ENCOUNTER — Other Ambulatory Visit (HOSPITAL_BASED_OUTPATIENT_CLINIC_OR_DEPARTMENT_OTHER): Payer: Self-pay

## 2021-03-12 ENCOUNTER — Other Ambulatory Visit (HOSPITAL_BASED_OUTPATIENT_CLINIC_OR_DEPARTMENT_OTHER): Payer: Self-pay

## 2021-03-12 MED ORDER — PHENTERMINE HCL 37.5 MG PO TABS
ORAL_TABLET | ORAL | 0 refills | Status: DC
  Filled 2021-03-12: qty 30, 30d supply, fill #0

## 2021-04-09 ENCOUNTER — Other Ambulatory Visit (HOSPITAL_BASED_OUTPATIENT_CLINIC_OR_DEPARTMENT_OTHER): Payer: Self-pay

## 2021-04-09 MED ORDER — PHENTERMINE HCL 37.5 MG PO TABS
ORAL_TABLET | ORAL | 0 refills | Status: DC
  Filled 2021-04-09: qty 30, 30d supply, fill #0

## 2021-05-04 ENCOUNTER — Other Ambulatory Visit (HOSPITAL_BASED_OUTPATIENT_CLINIC_OR_DEPARTMENT_OTHER): Payer: Self-pay

## 2021-05-04 MED ORDER — PHENTERMINE HCL 37.5 MG PO TABS
ORAL_TABLET | ORAL | 0 refills | Status: DC
  Filled 2021-05-04 – 2021-05-21 (×2): qty 30, 30d supply, fill #0

## 2021-05-11 ENCOUNTER — Other Ambulatory Visit (HOSPITAL_BASED_OUTPATIENT_CLINIC_OR_DEPARTMENT_OTHER): Payer: Self-pay

## 2021-05-13 ENCOUNTER — Other Ambulatory Visit (HOSPITAL_BASED_OUTPATIENT_CLINIC_OR_DEPARTMENT_OTHER): Payer: Self-pay

## 2021-05-21 ENCOUNTER — Other Ambulatory Visit (HOSPITAL_BASED_OUTPATIENT_CLINIC_OR_DEPARTMENT_OTHER): Payer: Self-pay

## 2021-06-04 ENCOUNTER — Other Ambulatory Visit (HOSPITAL_BASED_OUTPATIENT_CLINIC_OR_DEPARTMENT_OTHER): Payer: Self-pay

## 2021-06-04 MED ORDER — PHENTERMINE HCL 37.5 MG PO TABS
37.5000 mg | ORAL_TABLET | Freq: Every day | ORAL | 0 refills | Status: DC
  Filled 2021-06-04 – 2021-06-17 (×2): qty 30, 30d supply, fill #0

## 2021-06-17 ENCOUNTER — Other Ambulatory Visit (HOSPITAL_BASED_OUTPATIENT_CLINIC_OR_DEPARTMENT_OTHER): Payer: Self-pay

## 2021-07-03 ENCOUNTER — Other Ambulatory Visit (HOSPITAL_BASED_OUTPATIENT_CLINIC_OR_DEPARTMENT_OTHER): Payer: Self-pay

## 2021-07-03 MED ORDER — PHENTERMINE HCL 37.5 MG PO TABS
37.5000 mg | ORAL_TABLET | Freq: Every day | ORAL | 0 refills | Status: DC
  Filled 2021-07-03: qty 30, 30d supply, fill #0

## 2021-12-01 ENCOUNTER — Other Ambulatory Visit (HOSPITAL_BASED_OUTPATIENT_CLINIC_OR_DEPARTMENT_OTHER): Payer: Self-pay

## 2021-12-01 MED ORDER — METRONIDAZOLE 500 MG PO TABS
ORAL_TABLET | ORAL | 0 refills | Status: DC
  Filled 2021-12-01: qty 14, 7d supply, fill #0

## 2021-12-29 ENCOUNTER — Other Ambulatory Visit (HOSPITAL_BASED_OUTPATIENT_CLINIC_OR_DEPARTMENT_OTHER): Payer: Self-pay

## 2021-12-29 MED ORDER — TINIDAZOLE 500 MG PO TABS
1000.0000 mg | ORAL_TABLET | Freq: Every day | ORAL | 0 refills | Status: DC
  Filled 2021-12-29 – 2021-12-30 (×2): qty 10, 5d supply, fill #0

## 2021-12-30 ENCOUNTER — Other Ambulatory Visit (HOSPITAL_BASED_OUTPATIENT_CLINIC_OR_DEPARTMENT_OTHER): Payer: Self-pay

## 2022-01-15 ENCOUNTER — Emergency Department (HOSPITAL_BASED_OUTPATIENT_CLINIC_OR_DEPARTMENT_OTHER)
Admission: EM | Admit: 2022-01-15 | Discharge: 2022-01-15 | Disposition: A | Discharge status: Home / Self Care | Payer: Medicaid Other | Attending: Emergency Medicine | Admitting: Emergency Medicine

## 2022-01-15 ENCOUNTER — Encounter (HOSPITAL_BASED_OUTPATIENT_CLINIC_OR_DEPARTMENT_OTHER): Payer: Self-pay

## 2022-01-15 ENCOUNTER — Other Ambulatory Visit (HOSPITAL_BASED_OUTPATIENT_CLINIC_OR_DEPARTMENT_OTHER): Payer: Self-pay

## 2022-01-15 ENCOUNTER — Other Ambulatory Visit: Payer: Self-pay

## 2022-01-15 ENCOUNTER — Emergency Department (HOSPITAL_BASED_OUTPATIENT_CLINIC_OR_DEPARTMENT_OTHER): Payer: Medicaid Other

## 2022-01-15 DIAGNOSIS — D72829 Elevated white blood cell count, unspecified: Secondary | ICD-10-CM | POA: Insufficient documentation

## 2022-01-15 DIAGNOSIS — J36 Peritonsillar abscess: Secondary | ICD-10-CM | POA: Insufficient documentation

## 2022-01-15 DIAGNOSIS — Z3201 Encounter for pregnancy test, result positive: Secondary | ICD-10-CM

## 2022-01-15 DIAGNOSIS — O99511 Diseases of the respiratory system complicating pregnancy, first trimester: Secondary | ICD-10-CM | POA: Insufficient documentation

## 2022-01-15 DIAGNOSIS — Z20822 Contact with and (suspected) exposure to covid-19: Secondary | ICD-10-CM | POA: Insufficient documentation

## 2022-01-15 LAB — RESP PANEL BY RT-PCR (FLU A&B, COVID) ARPGX2
Influenza A by PCR: NEGATIVE
Influenza B by PCR: NEGATIVE
SARS Coronavirus 2 by RT PCR: NEGATIVE

## 2022-01-15 LAB — BASIC METABOLIC PANEL
Anion gap: 7 (ref 5–15)
BUN: 5 mg/dL — ABNORMAL LOW (ref 6–20)
CO2: 23 mmol/L (ref 22–32)
Calcium: 9 mg/dL (ref 8.9–10.3)
Chloride: 105 mmol/L (ref 98–111)
Creatinine, Ser: 0.91 mg/dL (ref 0.44–1.00)
GFR, Estimated: >60 mL/min (ref >60)
Glucose, Bld: 115 mg/dL — ABNORMAL HIGH (ref 70–99)
Potassium: 3.7 mmol/L (ref 3.5–5.1)
Sodium: 135 mmol/L (ref 135–145)

## 2022-01-15 LAB — CBC WITH DIFFERENTIAL/PLATELET
Abs Immature Granulocytes: 0.04 10*3/uL (ref 0.00–0.07)
Basophils Absolute: 0 10*3/uL (ref 0.0–0.1)
Basophils Relative: 0 %
Eosinophils Absolute: 0 10*3/uL (ref 0.0–0.5)
Eosinophils Relative: 0 %
HCT: 40 % (ref 36.0–46.0)
Hemoglobin: 13.3 g/dL (ref 12.0–15.0)
Immature Granulocytes: 0 %
Lymphocytes Relative: 8 %
Lymphs Abs: 0.9 10*3/uL (ref 0.7–4.0)
MCH: 28.1 pg (ref 26.0–34.0)
MCHC: 33.3 g/dL (ref 30.0–36.0)
MCV: 84.4 fL (ref 80.0–100.0)
Monocytes Absolute: 0.8 10*3/uL (ref 0.1–1.0)
Monocytes Relative: 7 %
Neutro Abs: 10 10*3/uL — ABNORMAL HIGH (ref 1.7–7.7)
Neutrophils Relative %: 85 %
Platelets: 250 10*3/uL (ref 150–400)
RBC: 4.74 MIL/uL (ref 3.87–5.11)
RDW: 13.7 % (ref 11.5–15.5)
WBC: 11.8 10*3/uL — ABNORMAL HIGH (ref 4.0–10.5)
nRBC: 0 % (ref 0.0–0.2)

## 2022-01-15 LAB — GROUP A STREP BY PCR: Group A Strep by PCR: NOT DETECTED

## 2022-01-15 LAB — HCG, QUANTITATIVE, PREGNANCY: hCG, Beta Chain, Quant, S: 2315 m[IU]/mL — ABNORMAL HIGH (ref <5)

## 2022-01-15 MED ORDER — AMOXICILLIN-POT CLAVULANATE 875-125 MG PO TABS
1.0000 | ORAL_TABLET | Freq: Two times a day (BID) | ORAL | 0 refills | Status: DC
  Filled 2022-01-15: qty 28, 14d supply, fill #0

## 2022-01-15 MED ORDER — CLINDAMYCIN PHOSPHATE 600 MG/50ML IV SOLN
600.0000 mg | Freq: Once | INTRAVENOUS | Status: AC
  Administered 2022-01-15: 600 mg via INTRAVENOUS
  Filled 2022-01-15: qty 50

## 2022-01-15 MED ORDER — IOHEXOL 300 MG/ML  SOLN
100.0000 mL | Freq: Once | INTRAMUSCULAR | Status: AC | PRN
  Administered 2022-01-15: 75 mL via INTRAVENOUS

## 2022-01-15 MED ORDER — DEXAMETHASONE SODIUM PHOSPHATE 10 MG/ML IJ SOLN
10.0000 mg | Freq: Once | INTRAMUSCULAR | Status: AC
  Administered 2022-01-15: 10 mg via INTRAVENOUS
  Filled 2022-01-15: qty 1

## 2022-01-15 MED ORDER — LACTATED RINGERS IV BOLUS: 1000.0000 mL | Freq: Once | INTRAVENOUS | Status: DC

## 2022-01-15 MED ORDER — SODIUM CHLORIDE 0.9 % IV SOLN
2.0000 g | Freq: Once | INTRAVENOUS | Status: AC
  Administered 2022-01-15: 2 g via INTRAVENOUS
  Filled 2022-01-15: qty 20

## 2022-01-15 MED ORDER — SODIUM CHLORIDE 0.9 % IV BOLUS
1000.0000 mL | Freq: Once | INTRAVENOUS | Status: AC
  Administered 2022-01-15: 1000 mL via INTRAVENOUS

## 2022-01-15 MED ORDER — CLINDAMYCIN HCL 300 MG PO CAPS
300.0000 mg | ORAL_CAPSULE | Freq: Three times a day (TID) | ORAL | 0 refills | Status: AC
  Filled 2022-01-15: qty 30, 10d supply, fill #0

## 2022-01-15 NOTE — ED Notes (Signed)
Patient transported to CT 

## 2022-01-15 NOTE — Discharge Instructions (Addendum)
Your strep, COVID and flu were all negative today.,  However your CT scan shows that you do have a small possibly early abscess in the right tonsil.  We have given you some IV antibiotics here in the emergency department along with a shot of steroid that should stay in your system for a few days.  Please drink lots and lots of water to help flush out infection.  I have sent you home with antibiotic that you need to take 3 times daily for 10 days.  Please follow-up with the ear nose and throat specialist after the course of antibiotics for reevaluation. ? ?Additionally, your pregnancy test was positive today.  Please follow-up with your OB/GYN ?

## 2022-01-15 NOTE — ED Provider Notes (Signed)
?[facility] EMERGENCY DEPARTMENT ?Provider Note ? ? ?CSN: [id number] ?Arrival date & time: 01/15/22  1143 ? ?  ? ?History ? ?Chief Complaint  ?Patient presents with  ? Sore Throat  ? ? ?Taylor Colon is a 26 y.o. female who presents to the ED for evaluation of sore throat, fever chills and body aches for 3 days.  Patient notes sore throat significantly worsened since last night with pain worse on the right side.  Also endorses muffled voice and is having difficulty swallowing.  She took some Motrin and Tylenol without significant improvement in pain, and she believes pain is worsening over time instead of improving.  She denies cough, congestion, back pain, abdominal pain, nausea, vomiting and diarrhea. ? ? ?Sore Throat ? ? ?  ? ?Home Medications ?Prior to Admission medications   ?Medication Sig Start Date End Date Taking? Authorizing Provider  ?clindamycin (CLEOCIN) 300 MG capsule Take 1 capsule (300 mg total) by mouth 3 (three) times daily for 10 days. 01/15/22 01/25/22 Yes Tonye Pearson, PA-C  ?LamoTRIgine (LAMICTAL XR) 200 MG TB24 Take 1 tablet by mouth daily. 300 mg    [provider]  ?metroNIDAZOLE (FLAGYL) 500 MG tablet Take 1 tablet (500 mg total) by mouth 2 times daily for 7 days. 12/01/21     ?phentermine (ADIPEX-P) 37.5 MG tablet Take 1 tablet (37.5 mg total) by mouth daily. 07/03/21     ?tinidazole (TINDAMAX) 500 MG tablet Take 2 tablets (1,000 mg total) by mouth daily for 5 days. 12/29/21     ?   ? ?Allergies    ?Patient has no known allergies.   ? ?Review of Systems   ?Review of Systems ? ?Physical Exam ?Updated Vital Signs ?BP 114/73   Pulse (!) 114   Temp (!) 100.6 ?F (38.1 ?C) (Oral)   Resp 18   Ht 5\' 4"  (1.626 m)   Wt 115.7 kg   LMP 12/17/2021   SpO2 100%   BMI 43.77 kg/m?  ?Physical Exam ? ?ED Results / Procedures / Treatments   ?Labs ?(all labs ordered are listed, but only abnormal results are displayed) ?Labs Reviewed  ?BASIC METABOLIC PANEL - Abnormal; Notable for the  following components:  ?    Result Value  ? Glucose, Bld 115 (*)   ? BUN 5 (*)   ? All other components within normal limits  ?CBC WITH DIFFERENTIAL/PLATELET - Abnormal; Notable for the following components:  ? WBC 11.8 (*)   ? Neutro Abs 10.0 (*)   ? All other components within normal limits  ?HCG, QUANTITATIVE, PREGNANCY - Abnormal; Notable for the following components:  ? hCG, Beta Chain, Quant, S 2,315 (*)   ? All other components within normal limits  ?GROUP A STREP BY PCR  ?RESP PANEL BY RT-PCR (FLU A&B, COVID) [id number]  ? ? ?EKG ?None ? ?Radiology ?CT Soft Tissue Neck W Contrast ? ?Result Date: 01/15/2022 ?CLINICAL DATA:  Soft tissue swelling, infection suspected, neck xray done EXAM: CT NECK WITH CONTRAST TECHNIQUE: Multidetector CT imaging of the neck was performed using the standard protocol following the bolus administration of intravenous contrast. RADIATION DOSE REDUCTION: This exam was performed according to the departmental dose-optimization program which includes automated exposure control, adjustment of the mA and/or kV according to patient size and/or use of iterative reconstruction technique. CONTRAST:  16mL OMNIPAQUE IOHEXOL 300 MG/ML  SOLN COMPARISON:  None. FINDINGS: Pharynx and larynx: Enlarged, mildly heterogeneous and striated palatine tonsils bilaterally, compatible with tonsillitis. Within the right  tonsil there is a somewhat ill-defined 1.9 x 1.0 cm area of hypoattenuation, compatible with phlegmon/early abscess (series 3, image 40). Smaller (6 x 3 mm) fluid collection in the left tonsil (also series 3, image 40). Mild effacement of the oropharyngeal airway, which remains patent. Unremarkable appearance of the larynx. Normal epiglottis. Salivary glands: No inflammation, mass, or stone. Thyroid: Normal. Lymph nodes: Prominent upper cervical chain nodes bilaterally. Vascular: Limited assessment due to non arterial timing and beam hardening artifact. The visualized major arteries in the neck  appear grossly patent. Limited intracranial: Negative. Visualized orbits: Negative. Mastoids and visualized paranasal sinuses: Clear. Skeleton: No acute or aggressive process. Upper chest: Visualized lung apices are clear. IMPRESSION: 1. Findings compatible with bilateral tonsillitis with right larger than left ill-defined fluid collections that are compatible with phlegmon/early abscess, detailed above. 2. Prominent upper cervical chain nodes bilaterally, nonspecific but probably reactive given the above findings. Electronically Signed   By: Margaretha Sheffield M.D.   On: 01/15/2022 13:14   ? ?Procedures ?Procedures  ? ? ?Medications Ordered in ED ?Medications  ?iohexol (OMNIPAQUE) 300 MG/ML solution 100 mL (75 mLs Intravenous Contrast Given 01/15/22 1253)  ?clindamycin (CLEOCIN) IVPB 600 mg (0 mg Intravenous Stopped 01/15/22 1553)  ?dexamethasone (DECADRON) injection 10 mg (10 mg Intravenous Given 01/15/22 1350)  ?cefTRIAXone (ROCEPHIN) 2 g in sodium chloride 0.9 % 100 mL IVPB (0 g Intravenous Stopped 01/15/22 1512)  ?sodium chloride 0.9 % bolus 1,000 mL (0 mLs Intravenous Stopped 01/15/22 1521)  ? ? ?ED Course/ Medical Decision Making/ A&P ?  ?                        ?Medical Decision Making ?Amount and/or Complexity of Data Reviewed ?Labs: ordered. ?Radiology: ordered. ? ?Risk ?Prescription drug management. ? ? ?History:  ?Per HPI ?Social determinants of health: None ? ?Initial impression: ? ?This patient presents to the ED for concern of sore throat, this involves an extensive number of treatment options, and is a complaint that carries with it a high risk of complications and morbidity.    ? ? ?Lab Tests and EKG: ? ?I Ordered, reviewed, and interpreted labs and EKG.  The pertinent results include:  ?Positive pregnancy test ?Leukocytosis ?Negative strep, respiratory panel ? ? ?Imaging Studies ordered: ? ?I ordered imaging studies including  ?CT soft tissue neck with early abscess within the right tonsil  ?I  independently visualized and interpreted imaging and I agree with the radiologist interpretation.  ? ? ?Cardiac Monitoring: ? ?The patient was maintained on a cardiac monitor.  I personally viewed and interpreted the cardiac monitored which showed an underlying rhythm of: sinus tach ? ? ?Medicines ordered and prescription drug management: ? ?I ordered medication including: ?Clindamycin 600 mg IV ?Rocephin 2 g IV ?Decadron 10 mg ?NS bolus 1 L ?Reevaluation of the patient after these medicines showed that the patient improved ?I have reviewed the patients home medicines and have made adjustments as needed ? ?ED Course: ?26 year old ill-appearing female presents to ED for evaluation of sore throat and fevers x3 days.  On exam, her voice is muffled.  Significant oropharynx erythema with bitonsillar swelling with exudate, right worse than left.  Mild uvular deviation.  Firm, approximately 1 cm, palpable tonsillar mass and cervical palpation.  Given patient's lack of upper respiratory complaints including cough, differentials are strep throat versus possible tonsillar abscess.  Given her clinical exam findings CT soft tissue neck which did confirm possible abscess of the right  tonsil, small fluid collection in the left tonsil as well.  Strep test negative.  Labs were overall reassuring aside from mild leukocytosis of 11.8.  Incidentally, patient's pregnancy test was positive for which she was unaware.  Most recent menstrual cycle was 12/17/2021.  I consulted with our pharmacist, Apolonio Schneiders, to discuss pregnancy safe antibiotics.  With clindamycin and Rocephin are considered safe in pregnancy, so patient was given 600 mg clindamycin IV, 2 g Rocephin IV along with normal saline bolus of 1 L.  Decadron 10 mg given for tonsillar swelling.  Patient was feeling better after reevaluation shortly after the Decadron, and given her overall nontoxic appearance, I do not think admission is indicated at this time.  Patient can be managed  outpatient with p.o. antibiotics with ENT follow-up.  Discussed plan with patient who is amenable to plan. ? ?Disposition: ? ?After consideration of the diagnostic results, physical exam, history and the patients response t

## 2022-01-15 NOTE — ED Notes (Signed)
Given  popsicle

## 2022-01-15 NOTE — ED Triage Notes (Signed)
Pt arrives with c/o sore throat, fever, chills, and body aches X3 days. NO medication today.  ?

## 2022-01-22 ENCOUNTER — Other Ambulatory Visit (HOSPITAL_BASED_OUTPATIENT_CLINIC_OR_DEPARTMENT_OTHER): Payer: Self-pay

## 2022-01-22 MED ORDER — METRONIDAZOLE 500 MG PO TABS
ORAL_TABLET | ORAL | 0 refills | Status: DC
  Filled 2022-01-22: qty 14, 7d supply, fill #0

## 2022-02-23 ENCOUNTER — Other Ambulatory Visit (HOSPITAL_BASED_OUTPATIENT_CLINIC_OR_DEPARTMENT_OTHER): Payer: Self-pay

## 2022-02-23 MED ORDER — LEVETIRACETAM 500 MG PO TABS
ORAL_TABLET | ORAL | 0 refills | Status: DC
  Filled 2022-02-23: qty 180, 90d supply, fill #0

## 2022-02-25 ENCOUNTER — Other Ambulatory Visit (HOSPITAL_BASED_OUTPATIENT_CLINIC_OR_DEPARTMENT_OTHER): Payer: Self-pay

## 2022-02-25 MED ORDER — IBUPROFEN 800 MG PO TABS
800.0000 mg | ORAL_TABLET | Freq: Four times a day (QID) | ORAL | 0 refills | Status: DC | PRN
  Filled 2022-02-25: qty 30, 8d supply, fill #0

## 2022-02-25 MED ORDER — OXYCODONE HCL 5 MG PO TABS
ORAL_TABLET | ORAL | 0 refills | Status: DC
  Filled 2022-02-25: qty 14, 5d supply, fill #0

## 2022-02-25 MED ORDER — MISOPROSTOL 200 MCG PO TABS
ORAL_TABLET | ORAL | 1 refills | Status: DC
  Filled 2022-02-25 (×2): qty 1, 1d supply, fill #0

## 2022-10-04 DIAGNOSIS — O24419 Gestational diabetes mellitus in pregnancy, unspecified control: Secondary | ICD-10-CM

## 2022-10-04 HISTORY — DX: Gestational diabetes mellitus in pregnancy, unspecified control: O24.419

## 2022-10-12 ENCOUNTER — Other Ambulatory Visit (HOSPITAL_BASED_OUTPATIENT_CLINIC_OR_DEPARTMENT_OTHER): Payer: Self-pay

## 2022-10-12 ENCOUNTER — Emergency Department (HOSPITAL_BASED_OUTPATIENT_CLINIC_OR_DEPARTMENT_OTHER)
Admission: EM | Admit: 2022-10-12 | Discharge: 2022-10-12 | Disposition: A | Discharge status: Home / Self Care | Payer: Medicaid Other | Attending: Emergency Medicine | Admitting: Emergency Medicine

## 2022-10-12 ENCOUNTER — Encounter (HOSPITAL_BASED_OUTPATIENT_CLINIC_OR_DEPARTMENT_OTHER): Payer: Self-pay

## 2022-10-12 ENCOUNTER — Other Ambulatory Visit: Payer: Self-pay

## 2022-10-12 DIAGNOSIS — Z1152 Encounter for screening for COVID-19: Secondary | ICD-10-CM | POA: Diagnosis not present

## 2022-10-12 DIAGNOSIS — J029 Acute pharyngitis, unspecified: Secondary | ICD-10-CM

## 2022-10-12 LAB — RESP PANEL BY RT-PCR (RSV, FLU A&B, COVID)  RVPGX2
Influenza A by PCR: NEGATIVE
Influenza B by PCR: NEGATIVE
Resp Syncytial Virus by PCR: NEGATIVE
SARS Coronavirus 2 by RT PCR: NEGATIVE

## 2022-10-12 LAB — GROUP A STREP BY PCR: Group A Strep by PCR: NOT DETECTED

## 2022-10-12 MED ORDER — METHYLPREDNISOLONE SODIUM SUCC 125 MG IJ SOLR
80.0000 mg | Freq: Once | INTRAMUSCULAR | Status: AC
  Administered 2022-10-12: 80 mg via INTRAMUSCULAR
  Filled 2022-10-12: qty 2

## 2022-10-12 MED ORDER — AMOXICILLIN-POT CLAVULANATE 875-125 MG PO TABS
1.0000 | ORAL_TABLET | Freq: Two times a day (BID) | ORAL | 0 refills | Status: DC
  Filled 2022-10-12: qty 14, 7d supply, fill #0

## 2022-10-12 NOTE — ED Notes (Signed)
Pt stable at time of discharge. RR WDL. No acute distress noted. Pt verbalized understanding of discharge instructions as discuss. Pt ambulatory to lobby.

## 2022-10-12 NOTE — ED Provider Notes (Signed)
[facility] EMERGENCY DEPARTMENT Provider Note   CSN: [id number] Arrival date & time: 10/12/22  1431     History  Chief Complaint  Patient presents with   Sore Throat    Taylor Colon is a 27 y.o. female.  27 year old female presents today for evaluation of pharyngitis that started yesterday.  States this feels similar to the time she had peritonsillar abscess few months back.  During interview patient does have found drink, and bag of fast food at bedside.  She is actively chewing and swallowing without difficulty.  Denies fever.  States she has had cough for about a week or so denies other URI symptoms.  Denies any other recent sick contacts.  Denies fever.  Denies drooling.  The history is provided by the patient. No language interpreter was used.       Home Medications Prior to Admission medications   Medication Sig Start Date End Date Taking? Authorizing Provider  amoxicillin-clavulanate (AUGMENTIN) 875-125 MG tablet Take 1 tablet by mouth every 12 (twelve) hours. 10/12/22  Yes Tiasha Helvie, PA-C  ibuprofen (ADVIL) 800 MG tablet Take 1 tablet (800 mg total) by mouth every 6 (six) hours as needed. 02/25/22     LamoTRIgine (LAMICTAL XR) 200 MG TB24 Take 1 tablet by mouth daily. 300 mg    [provider]  levETIRAcetam (KEPPRA) 500 MG tablet Take 1 tablet (500 mg total) by mouth 2 times daily. 02/23/22     misoprostol (CYTOTEC) 200 MCG tablet Take 1 tablet by mouth once as a single dose. Place buccally (place tablet between the gum and inner lining of the mouth cheek) as directed 02/25/22     oxyCODONE (OXY IR/ROXICODONE) 5 MG immediate release tablet Take 1 tablet (5 mg total) by mouth every 4 (four) hours as needed for up to 5 days. 02/25/22     phentermine (ADIPEX-P) 37.5 MG tablet Take 1 tablet (37.5 mg total) by mouth daily. 07/03/21     tinidazole (TINDAMAX) 500 MG tablet Take 2 tablets (1,000 mg total) by mouth daily for 5 days. 12/29/21         Allergies     Patient has no known allergies.    Review of Systems   Review of Systems  Constitutional:  Negative for chills and fever.  HENT:  Positive for sore throat. Negative for drooling, trouble swallowing and voice change.   Respiratory:  Positive for cough. Negative for shortness of breath.   Gastrointestinal:  Negative for nausea.  All other systems reviewed and are negative.   Physical Exam Updated Vital Signs BP (!) 146/68 (BP Location: Left Arm)   Pulse (!) 120   Temp 98.6 F (37 C) (Oral)   Resp 18   Ht 5\' 4"  (1.626 m)   Wt 127 kg   LMP 10/06/2022 (Approximate)   SpO2 99%   BMI 48.06 kg/m  Physical Exam Vitals and nursing note reviewed.  Constitutional:      General: She is not in acute distress.    Appearance: Normal appearance. She is not ill-appearing.  HENT:     Head: Normocephalic and atraumatic.     Nose: Nose normal.     Mouth/Throat:     Mouth: Mucous membranes are moist.     Pharynx: Uvula midline. Posterior oropharyngeal erythema present. No oropharyngeal exudate.     Comments: Uvula is midline.  No obvious evidence of peritonsillar abscess, retropharyngeal abscess.  Mild erythema noted to the pharynx.  No tenderness or swelling to the  anterior neck. Eyes:     General: No scleral icterus.    Extraocular Movements: Extraocular movements intact.     Conjunctiva/sclera: Conjunctivae normal.  Cardiovascular:     Rate and Rhythm: Normal rate and regular rhythm.     Pulses: Normal pulses.  Pulmonary:     Effort: Pulmonary effort is normal. No respiratory distress.     Breath sounds: Normal breath sounds. No wheezing or rales.  Abdominal:     General: There is no distension.     Tenderness: There is no abdominal tenderness.  Musculoskeletal:        General: Normal range of motion.     Cervical back: Normal range of motion.  Skin:    General: Skin is warm and dry.  Neurological:     General: No focal deficit present.     Mental Status: She is alert. Mental  status is at baseline.     ED Results / Procedures / Treatments   Labs (all labs ordered are listed, but only abnormal results are displayed) Labs Reviewed  RESP PANEL BY RT-PCR (RSV, FLU A&B, COVID)  [id number]  GROUP A STREP BY PCR    EKG None  Radiology No results found.  Procedures Procedures    Medications Ordered in ED Medications  methylPREDNISolone sodium succinate (SOLU-MEDROL) 125 mg/2 mL injection 80 mg (has no administration in time range)    ED Course/ Medical Decision Making/ A&P                           Medical Decision Making Risk Prescription drug management.   27 year old female presents today for evaluation of pharyngitis.  Strep swab and respiratory panel were obtained.  Negative for strep.  Negative for COVID, flu, and RSV.  Overall well-appearing.  Actively eating a sandwich during the interview and swallowing without difficulty.  Overall well-appearing.  No evidence of abscess on exam.  Mild erythema noted to the pharynx.  Uvula is midline.  Discussed with the patient regarding obtaining CT of the neck to further evaluate for an abscess versus empiric treatment for tonsillitis versus small peritonsillar abscess.  She prefers to have treatment without obtaining imaging.  She will return to the emergency department for any concerning symptoms.  She will otherwise follow-up with the ENT that she has seen in the past and her PCP.  She is appropriate for discharge.  Discharged in stable condition.  Return precaution discussed.    Final Clinical Impression(s) / ED Diagnoses Final diagnoses:  Acute pharyngitis, unspecified etiology    Rx / DC Orders ED Discharge Orders          Ordered    amoxicillin-clavulanate (AUGMENTIN) 875-125 MG tablet  Every 12 hours        10/12/22 1549              Marita Kansas, PA-C 10/12/22 1615    Horton, Clabe Seal, DO 10/15/22 0848

## 2022-10-12 NOTE — ED Triage Notes (Signed)
C/o sore throat. States was here a few months ago with abscess, it feels similar.  Comes in with drink and bag of fast food.

## 2022-10-12 NOTE — Discharge Instructions (Signed)
Your exam today was overall reassuring.  Exam did not show any evidence of peritonsillar abscess.  You could have a small peritonsillar abscess versus tonsillitis.  For any concerning symptoms return to the emergency department otherwise follow-up with your primary care provider.  you got a shot of steroids in the emergency department.  I sent antibiotics into the pharmacy for you.

## 2023-01-17 ENCOUNTER — Encounter: Payer: Self-pay | Admitting: *Deleted

## 2023-04-28 ENCOUNTER — Emergency Department (HOSPITAL_BASED_OUTPATIENT_CLINIC_OR_DEPARTMENT_OTHER)
Admission: EM | Admit: 2023-04-28 | Discharge: 2023-04-28 | Disposition: A | Discharge status: Home / Self Care | Payer: BC Managed Care – PPO | Attending: Emergency Medicine | Admitting: Emergency Medicine

## 2023-04-28 ENCOUNTER — Encounter (HOSPITAL_BASED_OUTPATIENT_CLINIC_OR_DEPARTMENT_OTHER): Payer: Self-pay

## 2023-04-28 ENCOUNTER — Other Ambulatory Visit: Payer: Self-pay

## 2023-04-28 DIAGNOSIS — Z3A25 25 weeks gestation of pregnancy: Secondary | ICD-10-CM | POA: Insufficient documentation

## 2023-04-28 DIAGNOSIS — O368120 Decreased fetal movements, second trimester, not applicable or unspecified: Secondary | ICD-10-CM | POA: Diagnosis present

## 2023-04-28 DIAGNOSIS — R569 Unspecified convulsions: Secondary | ICD-10-CM | POA: Diagnosis not present

## 2023-04-28 DIAGNOSIS — O368190 Decreased fetal movements, unspecified trimester, not applicable or unspecified: Secondary | ICD-10-CM

## 2023-04-28 LAB — CBC WITH DIFFERENTIAL/PLATELET
Abs Immature Granulocytes: 0.04 10*3/uL (ref 0.00–0.07)
Basophils Absolute: 0 10*3/uL (ref 0.0–0.1)
Basophils Relative: 0 %
Eosinophils Absolute: 0.1 10*3/uL (ref 0.0–0.5)
Eosinophils Relative: 1 %
HCT: 34.9 % — ABNORMAL LOW (ref 36.0–46.0)
Hemoglobin: 11.5 g/dL — ABNORMAL LOW (ref 12.0–15.0)
Immature Granulocytes: 1 %
Lymphocytes Relative: 26 %
Lymphs Abs: 1.7 10*3/uL (ref 0.7–4.0)
MCH: 28.5 pg (ref 26.0–34.0)
MCHC: 33 g/dL (ref 30.0–36.0)
MCV: 86.6 fL (ref 80.0–100.0)
Monocytes Absolute: 0.6 10*3/uL (ref 0.1–1.0)
Monocytes Relative: 9 %
Neutro Abs: 4 10*3/uL (ref 1.7–7.7)
Neutrophils Relative %: 63 %
Platelets: 243 10*3/uL (ref 150–400)
RBC: 4.03 MIL/uL (ref 3.87–5.11)
RDW: 13.1 % (ref 11.5–15.5)
WBC: 6.3 10*3/uL (ref 4.0–10.5)
nRBC: 0 % (ref 0.0–0.2)

## 2023-04-28 LAB — I-STAT CHEM 8, ED
BUN: <3 mg/dL — ABNORMAL LOW (ref 6–20)
Calcium, Ion: 1.26 mmol/L (ref 1.15–1.40)
Chloride: 105 mmol/L (ref 98–111)
Creatinine, Ser: 0.6 mg/dL (ref 0.44–1.00)
Glucose, Bld: 79 mg/dL (ref 70–99)
HCT: 35 % — ABNORMAL LOW (ref 36.0–46.0)
Hemoglobin: 11.9 g/dL — ABNORMAL LOW (ref 12.0–15.0)
Potassium: 3.7 mmol/L (ref 3.5–5.1)
Sodium: 138 mmol/L (ref 135–145)
TCO2: 21 mmol/L — ABNORMAL LOW (ref 22–32)

## 2023-04-28 LAB — URINALYSIS, ROUTINE W REFLEX MICROSCOPIC
Bilirubin Urine: NEGATIVE
Glucose, UA: NEGATIVE mg/dL
Hgb urine dipstick: NEGATIVE
Ketones, ur: NEGATIVE mg/dL
Leukocytes,Ua: NEGATIVE
Nitrite: NEGATIVE
Protein, ur: NEGATIVE mg/dL
Specific Gravity, Urine: 1.015 (ref 1.005–1.030)
pH: 7 (ref 5.0–8.0)

## 2023-04-28 MED ORDER — LEVETIRACETAM 500 MG/5ML IV SOLN
2000.0000 mg | Freq: Once | INTRAVENOUS | Status: DC
Start: 2023-04-28 — End: 2023-04-28

## 2023-04-28 MED ORDER — LEVETIRACETAM IN NACL 1000 MG/100ML IV SOLN
1000.0000 mg | Freq: Once | INTRAVENOUS | Status: AC
  Administered 2023-04-28: 1000 mg via INTRAVENOUS
  Filled 2023-04-28: qty 100

## 2023-04-28 MED ORDER — LEVETIRACETAM 750 MG PO TABS: 750.0000 mg | ORAL_TABLET | Freq: Two times a day (BID) | ORAL | 0 refills | Status: DC

## 2023-04-28 NOTE — ED Triage Notes (Addendum)
Pt arrives with c/o jerking motions that started yesterday. Pt has hx of seizures. Per pt, she usually has these jerking motion before she has a seizure. Pt is currently [redacted] weeks pregnant. Pts last dose of Keppra was today, but hadn't had a dose since February. Per pt, appropriate fetal movement and denies ABD pain or vaginal bleeding. Pt endorses headache.

## 2023-04-28 NOTE — Discharge Instructions (Addendum)
Follow-up with your neurologist.  They may need to make medication adjustments.  Follow-up with your OB/GYN if you have any further issues with your pregnancy.  Please return if symptoms worsen otherwise.  As discussed do not do any dangerous activities including driving, swimming alone or other activities that if you experience a seizure we hurt yourself or others.  You will need clearance from neurologist in order to do these things.

## 2023-04-28 NOTE — Progress Notes (Signed)
Contacted by [facility] charge nurse at 2150 (04/28/23)  Contacted charge nurse to put patient back on monitor at 2155 Contacted RN to put patient back on the monitor at 2212 Patient good to come off monitor at 2240 if FHR reamins stable

## 2023-04-28 NOTE — ED Provider Notes (Signed)
[facility] EMERGENCY DEPARTMENT AT [facility] [place] Provider Note   CSN: [id number] Arrival date & time: 04/28/23  2034     History  Chief Complaint  Patient presents with   Jerking Motions    decreased fetal movement    Taylor Colon is a 27 y.o. female.  Patient here with concern for some jerking that occurs before she has seizures.  She has not been taking her Keppra.  She has history of seizures.  She is currently [redacted] weeks pregnant in 1 day.  She is having a little bit of decreased fetal movement today but denies any vaginal bleeding or vaginal discharge or abdominal pain or contractions.  She has had unremarkable pregnancies thus far.  She had a normal ultrasound she states at 20 weeks.  [facility]  She has not had any seizure-like activity otherwise.  She denies any fever headache chest pain or shortness of breath or abdominal pain otherwise.  The history is provided by the patient.       Home Medications Prior to Admission medications   Medication Sig Start Date End Date Taking? Authorizing Provider  amoxicillin-clavulanate (AUGMENTIN) 875-125 MG tablet Take 1 tablet by mouth every 12 (twelve) hours. 10/12/22   Marita Kansas, PA-C  LamoTRIgine (LAMICTAL XR) 200 MG TB24 Take 1 tablet by mouth daily. 300 mg    [provider]  levETIRAcetam (KEPPRA) 500 MG tablet Take 1 tablet (500 mg total) by mouth 2 times daily. 02/23/22     misoprostol (CYTOTEC) 200 MCG tablet Take 1 tablet by mouth once as a single dose. Place buccally (place tablet between the gum and inner lining of the mouth cheek) as directed 02/25/22     oxyCODONE (OXY IR/ROXICODONE) 5 MG immediate release tablet Take 1 tablet (5 mg total) by mouth every 4 (four) hours as needed for up to 5 days. 02/25/22     phentermine (ADIPEX-P) 37.5 MG tablet Take 1 tablet (37.5 mg total) by mouth daily. 07/03/21     tinidazole (TINDAMAX) 500 MG tablet Take 2 tablets (1,000 mg total) by mouth daily for 5  days. 12/29/21         Allergies    Patient has no known allergies.    Review of Systems   Review of Systems  Physical Exam Updated Vital Signs BP (!) 127/94   Pulse 97   Temp 98.2 F (36.8 C) (Oral)   Resp 20   Wt 112 kg   LMP 10/06/2022 (Approximate)   SpO2 100%   BMI 42.40 kg/m  Physical Exam Vitals and nursing note reviewed.  Constitutional:      General: She is not in acute distress.    Appearance: She is well-developed.  HENT:     Head: Normocephalic and atraumatic.     Nose: Nose normal.     Mouth/Throat:     Mouth: Mucous membranes are moist.  Eyes:     Extraocular Movements: Extraocular movements intact.     Conjunctiva/sclera: Conjunctivae normal.     Pupils: Pupils are equal, round, and reactive to light.  Cardiovascular:     Rate and Rhythm: Normal rate and regular rhythm.     Pulses: Normal pulses.     Heart sounds: Normal heart sounds. No murmur heard. Pulmonary:     Effort: Pulmonary effort is normal. No respiratory distress.     Breath sounds: Normal breath sounds.  Abdominal:     General: Abdomen is flat.  Palpations: Abdomen is soft.     Tenderness: There is no abdominal tenderness.  Musculoskeletal:        General: No swelling. Normal range of motion.     Cervical back: Normal range of motion and neck supple.  Skin:    General: Skin is warm and dry.     Capillary Refill: Capillary refill takes less than 2 seconds.  Neurological:     General: No focal deficit present.     Mental Status: She is alert and oriented to person, place, and time.     Cranial Nerves: No cranial nerve deficit.     Sensory: No sensory deficit.     Motor: No weakness.     Coordination: Coordination normal.  Psychiatric:        Mood and Affect: Mood normal.     ED Results / Procedures / Treatments   Labs (all labs ordered are listed, but only abnormal results are displayed) Labs Reviewed  CBC WITH DIFFERENTIAL/PLATELET - Abnormal; Notable for the following  components:      Result Value   Hemoglobin 11.5 (*)    HCT 34.9 (*)    All other components within normal limits  I-STAT CHEM 8, ED - Abnormal; Notable for the following components:   BUN <3 (*)    TCO2 21 (*)    Hemoglobin 11.9 (*)    HCT 35.0 (*)    All other components within normal limits  URINALYSIS, ROUTINE W REFLEX MICROSCOPIC    EKG None  Radiology No results found.  Procedures Procedures    Medications Ordered in ED Medications  levETIRAcetam (KEPPRA) IVPB 1000 mg/100 mL premix (0 mg Intravenous Stopped 04/28/23 2222)    Followed by  levETIRAcetam (KEPPRA) IVPB 1000 mg/100 mL premix (1,000 mg Intravenous New Bag/Given 04/28/23 2228)    ED Course/ Medical Decision Making/ A&P                             Medical Decision Making Amount and/or Complexity of Data Reviewed Labs: ordered.  Risk Prescription drug management.   Taylor Colon is here with decreased fetal movement, concern for may be seizure activity about the occur.  She has been noncompliant with her Keppra.  She took a dose today.  She has a history of seizures is on Keppra.  Has not taken it for a couple months.  She is about [redacted] weeks pregnant 1 day.  She is having some decreased fetal movement but having better fetal movement now.  She is put on tocometry and had fetal heart rate in the 150s.  She had some decelerations.  No decelerations.  No contractions.  I reviewed and interpreted tocometry.  OB/GYN team also reviewed these data and when they medically cleared her from an OB standpoint.  I did a bedside ultrasound that showed good fetal heart rate.  Good fetal movement as well.  Patient otherwise had basic labs that showed no significant anemia or electrolyte abnormality or kidney injury.  Urinalysis negative for infection per my review and interpretation of labs.  I talked with Dr. Iver Nestle with neurology regarding her seizures.  She does not have any seizure activity on exam.  She is having some auras  of jerking that usually occur before she has bigger seizures.  Ultimately neurology was able to review her old neurology notes.  She has tolerated Keppra well in the past.  We have loaded her with 2 g IV  Keppra per her neurologist in the past and will start her on 750 mg twice daily.  Recommend that she follow closely with her neurologist outpatient to further manage her seizure medications.  She was educated about seizure precautions.  Instructed not to drive or do any dangerous activities including swimming alone or other activities that if she had a seizure would hurt herself or others.  She has been told this before.  She understands.  Ultimately she is cleared by OB and doing well.  Discharged in good condition.  This chart was dictated using voice recognition software.  Despite best efforts to proofread,  errors can occur which can change the documentation meaning.         Final Clinical Impression(s) / ED Diagnoses Final diagnoses:  Seizure-like activity (HCC)  Decreased fetal movement during pregnancy, antepartum, single or unspecified fetus    Rx / DC Orders ED Discharge Orders     None         Virgina Norfolk, DO 04/28/23 2256

## 2023-06-15 IMAGING — CT CT NECK W/ CM
4 series · 14 of 33 positions shown, 17 images · IV contrast (Omnipaque)
Comparison: None.

CLINICAL DATA: Soft tissue swelling, infection suspected, neck xray
done

EXAM:
CT NECK WITH CONTRAST
TECHNIQUE: Multidetector CT imaging of the neck was performed using the
standard protocol following the bolus administration of intravenous
contrast.

[Series 3: axial neck · axial · 0.46mm/px · z∈[-244,-112]mm · 5 of 100 slices shown, 7 images]
[im 17/100  soft-tissue]
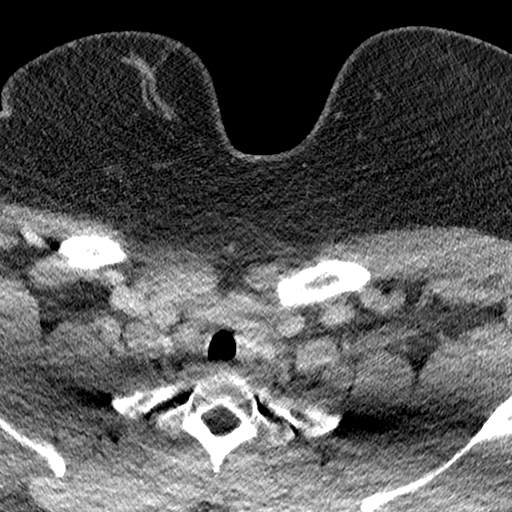
[im 17/100  bone]
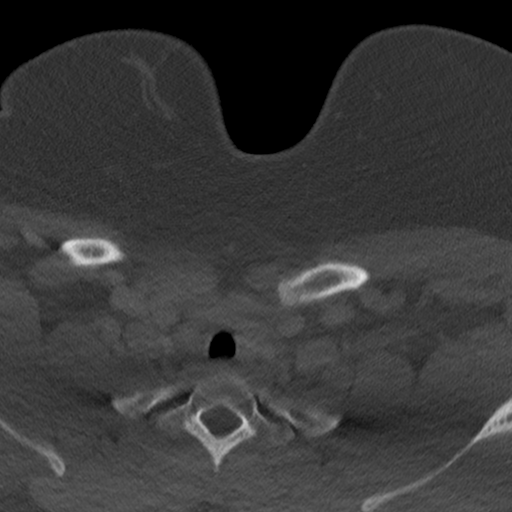
[im 34/100  bone]
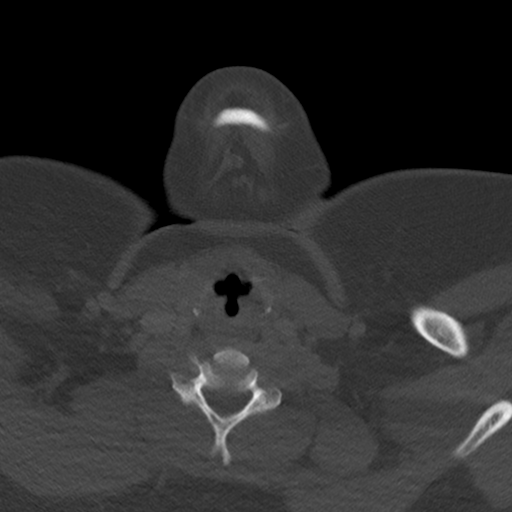
[im 50/100  bone]
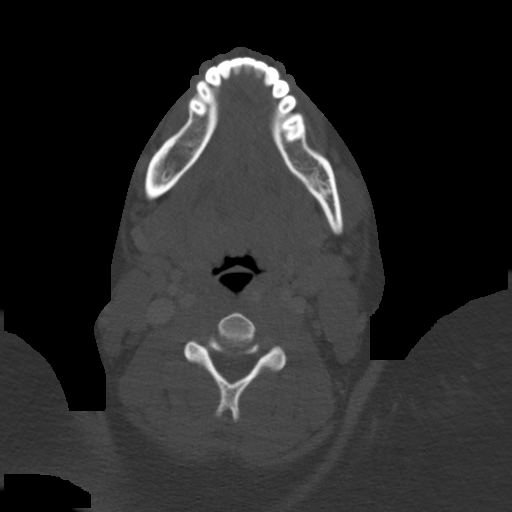
[im 67/100  bone]
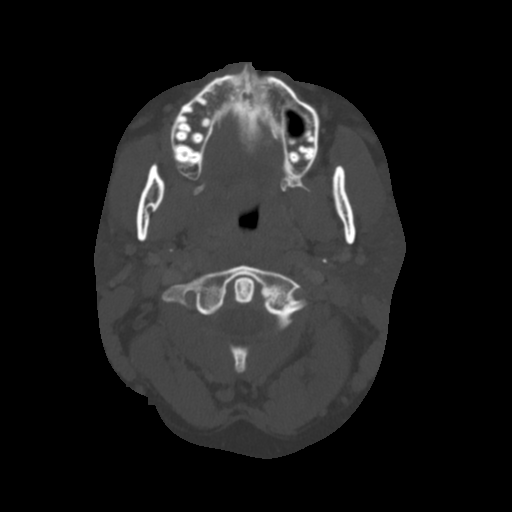
[im 83/100  soft-tissue]
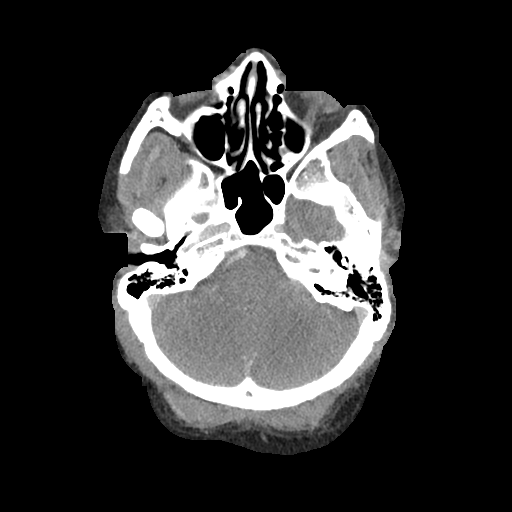
[im 83/100  bone]
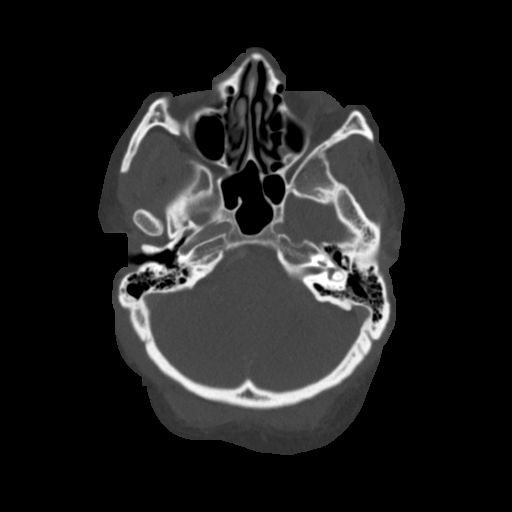

[Series 4: sag neck · sagittal · 0.42mm/px · 5 of 101 slices shown, 6 images]
[im 34/101  bone]
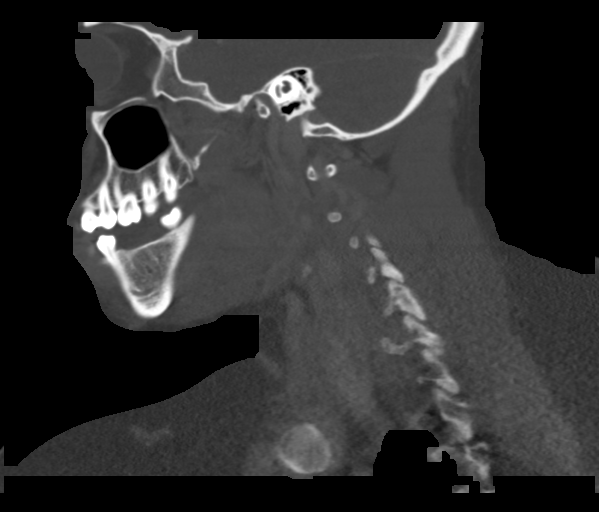
[im 42/101  bone]
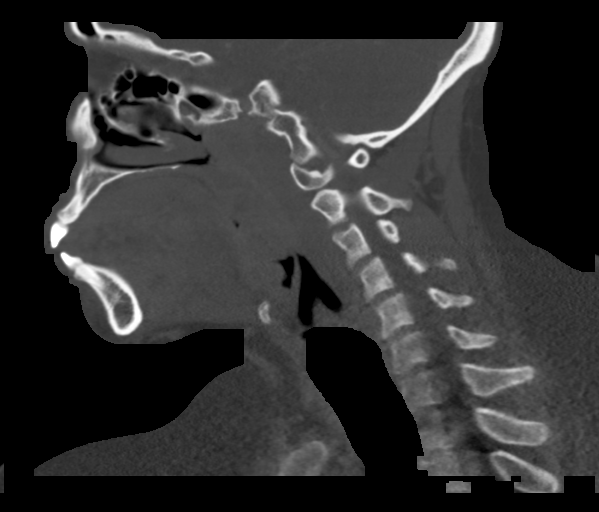
[im 51/101  soft-tissue]
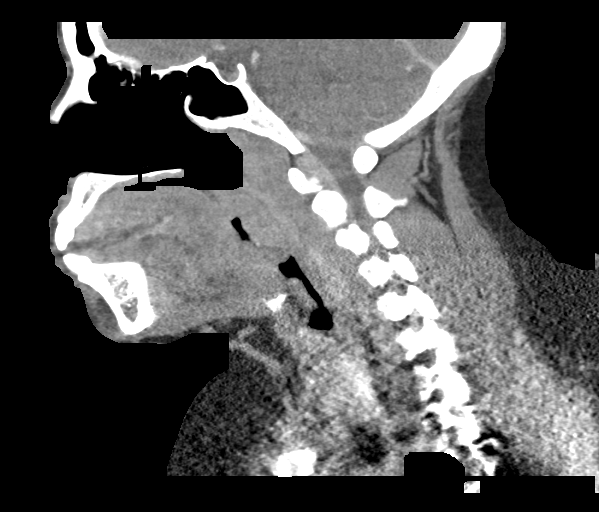
[im 51/101  bone]
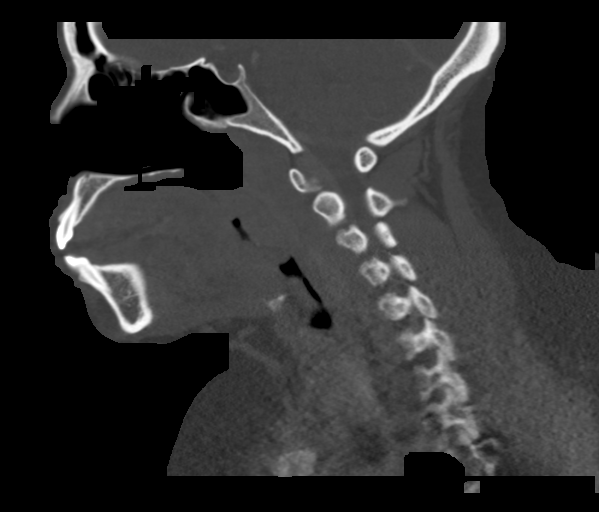
[im 59/101  bone]
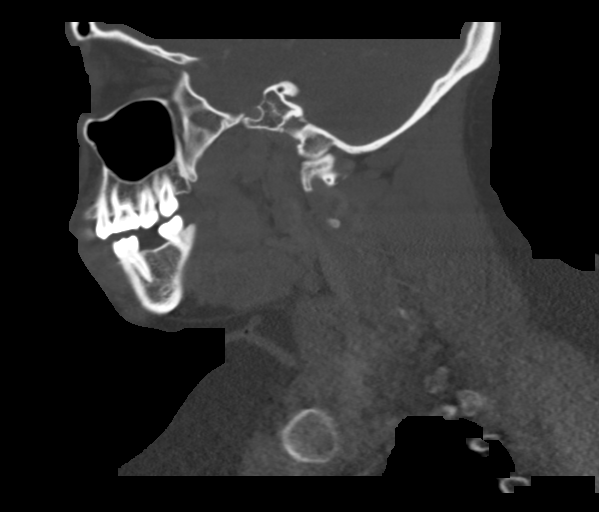
[im 67/101  bone]
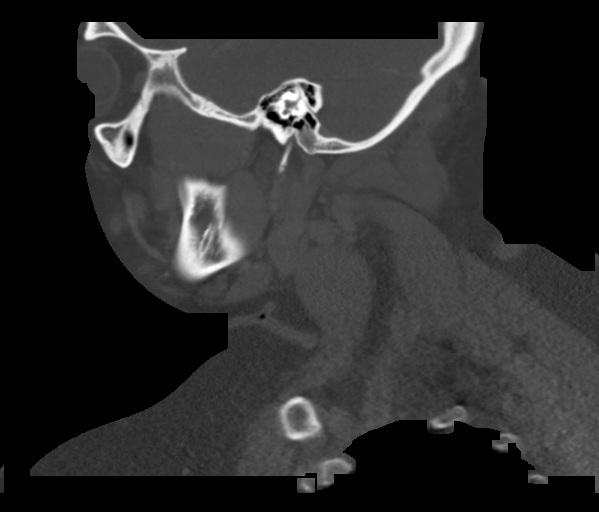

[Series 5: cor neck · coronal · 0.42mm/px · 3 of 113 slices shown]
[im 23/113  bone]
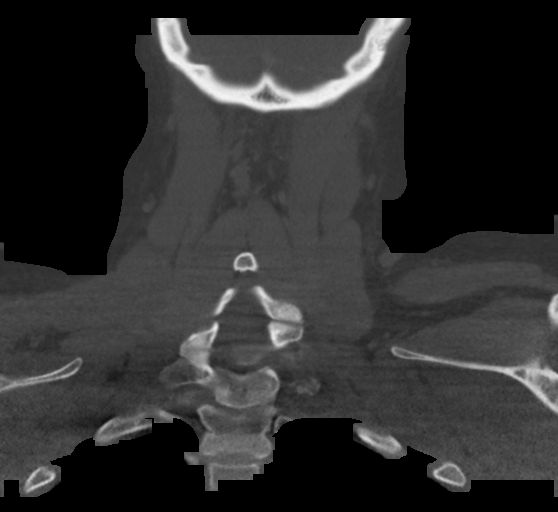
[im 45/113  bone]
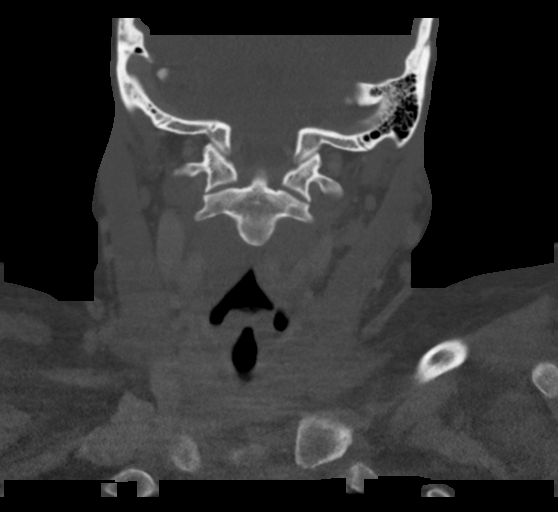
[im 68/113  bone]
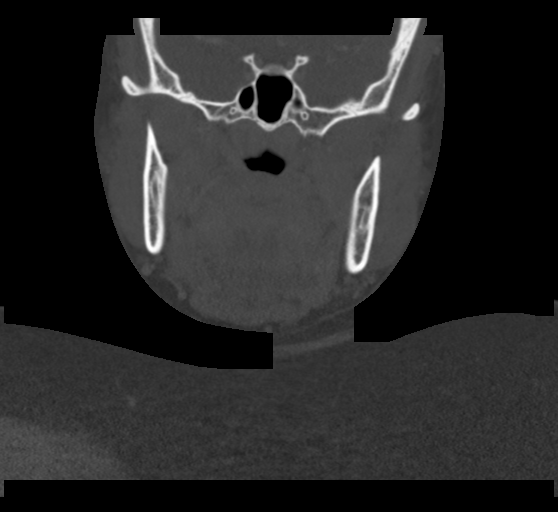

[Series 6: ax oropharynx · axial · 0.39mm/px · 1 of 101 slices shown]
[im 17/101  bone]
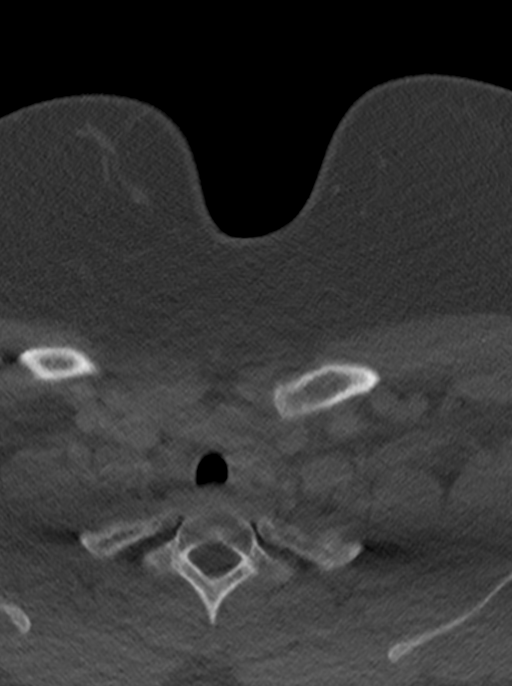

[14 of 33 positions shown; findings below may reference images not displayed]

RADIATION DOSE REDUCTION: This exam was performed according to the
departmental dose-optimization program which includes automated
exposure control, adjustment of the mA and/or kV according to
patient size and/or use of iterative reconstruction technique.

CONTRAST:  75mL OMNIPAQUE IOHEXOL 300 MG/ML  SOLN
FINDINGS: Pharynx and larynx: Enlarged, mildly heterogeneous and striated
palatine tonsils bilaterally, compatible with tonsillitis. Within
the right tonsil there is a somewhat ill-defined 1.9 x 1.0 cm area
of hypoattenuation, compatible with phlegmon/early abscess (series
3, image 40). Smaller (6 x 3 mm) fluid collection in the left tonsil
(also series 3, image 40). Mild effacement of the oropharyngeal
airway, which remains patent. Unremarkable appearance of the larynx.
Normal epiglottis.

Salivary glands: No inflammation, mass, or stone.

Thyroid: Normal.

Lymph nodes: Prominent upper cervical chain nodes bilaterally.

Vascular: Limited assessment due to non arterial timing and beam
hardening artifact. The visualized major arteries in the neck appear
grossly patent.

Limited intracranial: Negative.

Visualized orbits: Negative.

Mastoids and visualized paranasal sinuses: Clear.

Skeleton: No acute or aggressive process.

Upper chest: Visualized lung apices are clear.
IMPRESSION: 1. Findings compatible with bilateral tonsillitis with right larger
than left ill-defined fluid collections that are compatible with
phlegmon/early abscess, detailed above.
2. Prominent upper cervical chain nodes bilaterally, nonspecific but
probably reactive given the above findings.

## 2023-07-18 ENCOUNTER — Other Ambulatory Visit: Payer: Self-pay

## 2023-07-18 ENCOUNTER — Encounter (HOSPITAL_BASED_OUTPATIENT_CLINIC_OR_DEPARTMENT_OTHER): Payer: Self-pay

## 2023-07-18 ENCOUNTER — Emergency Department (HOSPITAL_BASED_OUTPATIENT_CLINIC_OR_DEPARTMENT_OTHER)
Admission: EM | Admit: 2023-07-18 | Discharge: 2023-07-18 | Disposition: A | Discharge status: Home / Self Care | Payer: BC Managed Care – PPO | Attending: Emergency Medicine | Admitting: Emergency Medicine

## 2023-07-18 DIAGNOSIS — O26893 Other specified pregnancy related conditions, third trimester: Secondary | ICD-10-CM | POA: Insufficient documentation

## 2023-07-18 DIAGNOSIS — J029 Acute pharyngitis, unspecified: Secondary | ICD-10-CM | POA: Diagnosis not present

## 2023-07-18 DIAGNOSIS — Z3A36 36 weeks gestation of pregnancy: Secondary | ICD-10-CM | POA: Insufficient documentation

## 2023-07-18 DIAGNOSIS — J3489 Other specified disorders of nose and nasal sinuses: Secondary | ICD-10-CM | POA: Diagnosis not present

## 2023-07-18 DIAGNOSIS — R059 Cough, unspecified: Secondary | ICD-10-CM | POA: Insufficient documentation

## 2023-07-18 DIAGNOSIS — Z20822 Contact with and (suspected) exposure to covid-19: Secondary | ICD-10-CM | POA: Diagnosis not present

## 2023-07-18 DIAGNOSIS — J069 Acute upper respiratory infection, unspecified: Secondary | ICD-10-CM

## 2023-07-18 LAB — RESP PANEL BY RT-PCR (RSV, FLU A&B, COVID)  RVPGX2
Influenza A by PCR: NEGATIVE
Influenza B by PCR: NEGATIVE
Resp Syncytial Virus by PCR: NEGATIVE
SARS Coronavirus 2 by RT PCR: NEGATIVE

## 2023-07-18 LAB — GROUP A STREP BY PCR: Group A Strep by PCR: NOT DETECTED

## 2023-07-18 MED ORDER — ACETAMINOPHEN 325 MG PO TABS
650.0000 mg | ORAL_TABLET | Freq: Once | ORAL | Status: AC
  Administered 2023-07-18: 650 mg via ORAL
  Filled 2023-07-18: qty 2

## 2023-07-18 NOTE — ED Provider Notes (Incomplete)
[place] [facility] Provider Note   CSN: [id number] Arrival date & time: 07/18/23  2201     History {Add pertinent medical, surgical, social history, OB history to HPI:1} Chief Complaint  Patient presents with  . Sore Throat    Taylor Colon is a 27 y.o. female.  Denies significant chronic PMH but knows she is 36 weeks 5 days pregnant and does have gestational diabetes which is under control.  Denies vaginal bleeding or vaginal leaking, movement has been normal no abdominal pain she is here today for evaluation of sore throat cough and runny nose that started 2 days ago.  She has not a fever, no chest pain or shortness of breath.  Cough is occasionally productive of clear to yellow sputum.  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  Otherwise no known sick contacts she has not taken any medications at home.   Sore Throat       Home Medications Prior to Admission medications   Medication Sig Start Date End Date Taking? Authorizing Provider  amoxicillin-clavulanate (AUGMENTIN) 875-125 MG tablet Take 1 tablet by mouth every 12 (twelve) hours. 10/12/22   Marita Kansas, PA-C  LamoTRIgine (LAMICTAL XR) 200 MG TB24 Take 1 tablet by mouth daily. 300 mg    [provider]  levETIRAcetam (KEPPRA) 750 MG tablet Take 1 tablet (750 mg total) by mouth 2 (two) times daily. 04/28/23 05/28/23  Curatolo, Adam, DO  misoprostol (CYTOTEC) 200 MCG tablet Take 1 tablet by mouth once as a single dose. Place buccally (place tablet between the gum and inner lining of the mouth cheek) as directed 02/25/22     oxyCODONE (OXY IR/ROXICODONE) 5 MG immediate release tablet Take 1 tablet (5 mg total) by mouth every 4 (four) hours as needed for up to 5 days. 02/25/22     phentermine (ADIPEX-P) 37.5 MG tablet Take 1 tablet (37.5 mg total) by mouth daily. 07/03/21     tinidazole (TINDAMAX) 500 MG tablet Take 2 tablets (1,000 mg total) by  mouth daily for 5 days. 12/29/21         Allergies    Patient has no known allergies.    Review of Systems   Review of Systems  Physical Exam Updated Vital Signs BP (!) 136/91 (BP Location: Left Arm)   Pulse (!) 102   Temp 98 F (36.7 C)   Resp 18   Ht 5\' 3"  (1.6 m)   Wt 120.2 kg   LMP 10/06/2022 (Approximate)   SpO2 100%   BMI 46.94 kg/m  Physical Exam Vitals and nursing note reviewed.  Constitutional:      General: She is not in acute distress.    Appearance: She is well-developed.  HENT:     Head: Normocephalic and atraumatic.     Right Ear: Tympanic membrane normal. No middle ear effusion. Tympanic membrane is not erythematous.     Left Ear: Tympanic membrane normal.  No middle ear effusion. Tympanic membrane is not erythematous.     Mouth/Throat:     Mouth: Mucous membranes are moist.     Pharynx: Uvula midline. No pharyngeal swelling, oropharyngeal exudate or posterior oropharyngeal erythema.     Comments: Mild amount of clear postnasal drip Eyes:     Conjunctiva/sclera: Conjunctivae normal.  Cardiovascular:     Rate and Rhythm: Normal rate and regular rhythm.     Heart sounds: No murmur heard. Pulmonary:  Effort: Pulmonary effort is normal. No respiratory distress.     Breath sounds: Normal breath sounds.  Abdominal:     Palpations: Abdomen is soft.     Tenderness: There is no abdominal tenderness.  Musculoskeletal:        General: No swelling.     Cervical back: Neck supple.  Skin:    General: Skin is warm and dry.     Capillary Refill: Capillary refill takes less than 2 seconds.  Neurological:     General: No focal deficit present.     Mental Status: She is alert and oriented to person, place, and time.  Psychiatric:        Mood and Affect: Mood normal.     ED Results / Procedures / Treatments   Labs (all labs ordered are listed, but only abnormal results are displayed) Labs Reviewed  RESP PANEL BY RT-PCR (RSV, FLU A&B, COVID)  RVPGX2  GROUP  A STREP BY PCR    EKG None  Radiology No results found.  Procedures Procedures  {Document cardiac monitor, telemetry assessment procedure when appropriate:1}  Medications Ordered in ED Medications - No data to display  ED Course/ Medical Decision Making/ A&P   {   Click here for ABCD2, HEART and other calculatorsREFRESH Note before signing :1}                              Medical Decision Making DDx: URI, tonsillitis, pharyngitis, peritonsillar abscess, retropharyngeal abscess, bronchitis, pneumonia, other ED course: Patient here for sore throat with cough and congestion x 2 days.  No fevers or chills.  Satting 100 on room air, lungs are clear no increased work of breathing.  She has mild tachycardic which is consistent with her being [redacted] weeks pregnant.  Her COVID flu RSV tests were negative.  She has no pregnancy related complaints specifically no abdominal pain, vaginal bleeding leaking of fluid or decreased fetal movement and fetal heart tones were measured by the nurse and are normal in the 160s..  Patient advised on Tylenol as needed over-the-counter for discomfort, she to also use honey and try warm or cool fluids for symptoms, discussed the need to maintain hydration and follow-up close with primary care she was agreeable with is also requesting work note which will be provided.   ***  {Document critical care time when appropriate:1} {Document review of labs and clinical decision tools ie heart score, Chads2Vasc2 etc:1}  {Document your independent review of radiology images, and any outside records:1} {Document your discussion with family members, caretakers, and with consultants:1} {Document social determinants of health affecting pt's care:1} {Document your decision making why or why not admission, treatments were needed:1} Final Clinical Impression(s) / ED Diagnoses Final diagnoses:  None    Rx / DC Orders ED Discharge Orders     None

## 2023-07-18 NOTE — ED Provider Notes (Signed)
[name] Provider Note   CSN: [id number] Arrival date & time: 07/18/23  2201     History  Chief Complaint  Patient presents with   Sore Throat    Taylor Colon is a 27 y.o. female.  Denies significant chronic PMH but knows she is 36 weeks 5 days pregnant and does have gestational diabetes which is under control.  Denies vaginal bleeding or vaginal leaking, movement has been normal no abdominal pain she is here today for evaluation of sore throat cough and runny nose that started 2 days ago.  She has not a fever, no chest pain or shortness of breath.  Cough is occasionally productive of clear to yellow sputum.  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  Otherwise no known sick contacts she has not taken any medications at home.   Sore Throat       Home Medications Prior to Admission medications   Medication Sig Start Date End Date Taking? Authorizing Provider  amoxicillin-clavulanate (AUGMENTIN) 875-125 MG tablet Take 1 tablet by mouth every 12 (twelve) hours. 10/12/22   Marita Kansas, PA-C  LamoTRIgine (LAMICTAL XR) 200 MG TB24 Take 1 tablet by mouth daily. 300 mg    [provider]  levETIRAcetam (KEPPRA) 750 MG tablet Take 1 tablet (750 mg total) by mouth 2 (two) times daily. 04/28/23 05/28/23  Curatolo, Adam, DO  misoprostol (CYTOTEC) 200 MCG tablet Take 1 tablet by mouth once as a single dose. Place buccally (place tablet between the gum and inner lining of the mouth cheek) as directed 02/25/22     oxyCODONE (OXY IR/ROXICODONE) 5 MG immediate release tablet Take 1 tablet (5 mg total) by mouth every 4 (four) hours as needed for up to 5 days. 02/25/22     phentermine (ADIPEX-P) 37.5 MG tablet Take 1 tablet (37.5 mg total) by mouth daily. 07/03/21     tinidazole (TINDAMAX) 500 MG tablet Take 2 tablets (1,000 mg total) by mouth daily for 5 days. 12/29/21         Allergies    Patient has no  known allergies.    Review of Systems   Review of Systems  Physical Exam Updated Vital Signs BP (!) 136/91 (BP Location: Left Arm)   Pulse (!) 102   Temp 98 F (36.7 C)   Resp 18   Ht 5\' 3"  (1.6 m)   Wt 120.2 kg   LMP 10/06/2022 (Approximate)   SpO2 100%   BMI 46.94 kg/m  Physical Exam Vitals and nursing note reviewed.  Constitutional:      General: She is not in acute distress.    Appearance: She is well-developed.  HENT:     Head: Normocephalic and atraumatic.     Right Ear: Tympanic membrane normal. No middle ear effusion. Tympanic membrane is not erythematous.     Left Ear: Tympanic membrane normal.  No middle ear effusion. Tympanic membrane is not erythematous.     Mouth/Throat:     Mouth: Mucous membranes are moist.     Pharynx: Uvula midline. No pharyngeal swelling, oropharyngeal exudate or posterior oropharyngeal erythema.     Comments: Mild amount of clear postnasal drip Eyes:     Conjunctiva/sclera: Conjunctivae normal.  Cardiovascular:     Rate and Rhythm: Normal rate and regular rhythm.     Heart sounds: No murmur heard. Pulmonary:     Effort: Pulmonary effort is normal. No respiratory distress.  Breath sounds: Normal breath sounds.  Abdominal:     Palpations: Abdomen is soft.     Tenderness: There is no abdominal tenderness.  Musculoskeletal:        General: No swelling.     Cervical back: Neck supple.  Skin:    General: Skin is warm and dry.     Capillary Refill: Capillary refill takes less than 2 seconds.  Neurological:     General: No focal deficit present.     Mental Status: She is alert and oriented to person, place, and time.  Psychiatric:        Mood and Affect: Mood normal.     ED Results / Procedures / Treatments   Labs (all labs ordered are listed, but only abnormal results are displayed) Labs Reviewed  RESP PANEL BY RT-PCR (RSV, FLU A&B, COVID)  RVPGX2  GROUP A STREP BY PCR    EKG None  Radiology No results  found.  Procedures Procedures    Medications Ordered in ED Medications - No data to display  ED Course/ Medical Decision Making/ A&P                                 Medical Decision Making DDx: URI, tonsillitis, pharyngitis, peritonsillar abscess, retropharyngeal abscess, bronchitis, pneumonia, other ED course: Patient here for sore throat with cough and congestion x 2 days.  No fevers or chills.  Satting 100 on room air, lungs are clear no increased work of breathing.  She has mild tachycardic which is consistent with her being [redacted] weeks pregnant.  Her COVID flu RSV tests were negative.  She has no pregnancy related complaints specifically no abdominal pain, vaginal bleeding leaking of fluid or decreased fetal movement and fetal heart tones were measured by the nurse and are normal in the 160s..  Patient advised on Tylenol as needed over-the-counter for discomfort, she to also use honey and try warm or cool fluids for symptoms, discussed the need to maintain hydration and follow-up close with primary care she was agreeable with is also requesting work note which will be provided.           Final Clinical Impression(s) / ED Diagnoses Final diagnoses:  None    Rx / DC Orders ED Discharge Orders     None         Josem Kaufmann 07/19/23 Marina Gravel, MD 07/19/23 0111

## 2023-07-18 NOTE — ED Triage Notes (Signed)
Patient states her throat is sore. Soreness started Saturday. Also states she has mucous, congestion and cough. Denies fever

## 2023-07-18 NOTE — Discharge Instructions (Addendum)
It was a pleasure taking care of you today.  You were seen for sore throat cough and congestion for the past several days.  Your exam was overall very reassuring, your oxygen is normal, you are negative for COVID flu RSV and strep throat.  Your symptoms are likely due to a virus.  Please drink plenty of fluids it is safe to take over-the-counter Tylenol as directed on packaging as needed for discomfort.  You could also try honey and warm tea as needed for sore throat and cough.

## 2024-02-12 ENCOUNTER — Inpatient Hospital Stay (HOSPITAL_BASED_OUTPATIENT_CLINIC_OR_DEPARTMENT_OTHER)
Admission: EM | Admit: 2024-02-12 | Discharge: 2024-02-15 | DRG: 832 | Disposition: A | Discharge status: Home / Self Care | Attending: Obstetrics & Gynecology | Admitting: Obstetrics & Gynecology

## 2024-02-12 ENCOUNTER — Emergency Department (HOSPITAL_BASED_OUTPATIENT_CLINIC_OR_DEPARTMENT_OTHER)

## 2024-02-12 ENCOUNTER — Other Ambulatory Visit: Payer: Self-pay

## 2024-02-12 ENCOUNTER — Encounter (HOSPITAL_BASED_OUTPATIENT_CLINIC_OR_DEPARTMENT_OTHER): Payer: Self-pay | Admitting: Emergency Medicine

## 2024-02-12 DIAGNOSIS — E876 Hypokalemia: Secondary | ICD-10-CM | POA: Diagnosis present

## 2024-02-12 DIAGNOSIS — O23591 Infection of other part of genital tract in pregnancy, first trimester: Secondary | ICD-10-CM | POA: Diagnosis present

## 2024-02-12 DIAGNOSIS — Z3A09 9 weeks gestation of pregnancy: Secondary | ICD-10-CM

## 2024-02-12 DIAGNOSIS — O2301 Infections of kidney in pregnancy, first trimester: Secondary | ICD-10-CM

## 2024-02-12 DIAGNOSIS — E871 Hypo-osmolality and hyponatremia: Secondary | ICD-10-CM | POA: Diagnosis present

## 2024-02-12 DIAGNOSIS — B3731 Acute candidiasis of vulva and vagina: Secondary | ICD-10-CM | POA: Diagnosis present

## 2024-02-12 DIAGNOSIS — G40B09 Juvenile myoclonic epilepsy, not intractable, without status epilepticus: Secondary | ICD-10-CM

## 2024-02-12 DIAGNOSIS — Z87891 Personal history of nicotine dependence: Secondary | ICD-10-CM

## 2024-02-12 DIAGNOSIS — Z3A10 10 weeks gestation of pregnancy: Secondary | ICD-10-CM

## 2024-02-12 DIAGNOSIS — O99281 Endocrine, nutritional and metabolic diseases complicating pregnancy, first trimester: Secondary | ICD-10-CM | POA: Diagnosis present

## 2024-02-12 DIAGNOSIS — O99351 Diseases of the nervous system complicating pregnancy, first trimester: Secondary | ICD-10-CM | POA: Diagnosis present

## 2024-02-12 DIAGNOSIS — G40909 Epilepsy, unspecified, not intractable, without status epilepticus: Secondary | ICD-10-CM

## 2024-02-12 DIAGNOSIS — O99011 Anemia complicating pregnancy, first trimester: Secondary | ICD-10-CM | POA: Diagnosis present

## 2024-02-12 DIAGNOSIS — O98811 Other maternal infectious and parasitic diseases complicating pregnancy, first trimester: Secondary | ICD-10-CM | POA: Diagnosis present

## 2024-02-12 DIAGNOSIS — Z1152 Encounter for screening for COVID-19: Secondary | ICD-10-CM

## 2024-02-12 HISTORY — DX: Infections of kidney in pregnancy, first trimester: O23.01

## 2024-02-12 LAB — COMPREHENSIVE METABOLIC PANEL WITH GFR
ALT: 20 U/L (ref 0–44)
AST: 28 U/L (ref 15–41)
Albumin: 3.8 g/dL (ref 3.5–5.0)
Alkaline Phosphatase: 102 U/L (ref 38–126)
Anion gap: 16 — ABNORMAL HIGH (ref 5–15)
BUN: 5 mg/dL — ABNORMAL LOW (ref 6–20)
CO2: 18 mmol/L — ABNORMAL LOW (ref 22–32)
Calcium: 9.2 mg/dL (ref 8.9–10.3)
Chloride: 100 mmol/L (ref 98–111)
Creatinine, Ser: 0.8 mg/dL (ref 0.44–1.00)
GFR, Estimated: >60 mL/min (ref >60)
Glucose, Bld: 118 mg/dL — ABNORMAL HIGH (ref 70–99)
Potassium: 3.1 mmol/L — ABNORMAL LOW (ref 3.5–5.1)
Sodium: 133 mmol/L — ABNORMAL LOW (ref 135–145)
Total Bilirubin: 0.4 mg/dL (ref 0.0–1.2)
Total Protein: 7.5 g/dL (ref 6.5–8.1)

## 2024-02-12 LAB — CBC WITH DIFFERENTIAL/PLATELET
Abs Immature Granulocytes: 0.05 10*3/uL (ref 0.00–0.07)
Basophils Absolute: 0 10*3/uL (ref 0.0–0.1)
Basophils Relative: 0 %
Eosinophils Absolute: 0 10*3/uL (ref 0.0–0.5)
Eosinophils Relative: 0 %
HCT: 31.2 % — ABNORMAL LOW (ref 36.0–46.0)
Hemoglobin: 10.2 g/dL — ABNORMAL LOW (ref 12.0–15.0)
Immature Granulocytes: 0 %
Lymphocytes Relative: 7 %
Lymphs Abs: 0.8 10*3/uL (ref 0.7–4.0)
MCH: 22.9 pg — ABNORMAL LOW (ref 26.0–34.0)
MCHC: 32.7 g/dL (ref 30.0–36.0)
MCV: 70.1 fL — ABNORMAL LOW (ref 80.0–100.0)
Monocytes Absolute: 1.6 10*3/uL — ABNORMAL HIGH (ref 0.1–1.0)
Monocytes Relative: 14 %
Neutro Abs: 9.2 10*3/uL — ABNORMAL HIGH (ref 1.7–7.7)
Neutrophils Relative %: 79 %
Platelets: 233 10*3/uL (ref 150–400)
RBC: 4.45 MIL/uL (ref 3.87–5.11)
RDW: 18.6 % — ABNORMAL HIGH (ref 11.5–15.5)
WBC: 11.6 10*3/uL — ABNORMAL HIGH (ref 4.0–10.5)
nRBC: 0 % (ref 0.0–0.2)

## 2024-02-12 LAB — LACTIC ACID, PLASMA: Lactic Acid, Venous: 1.5 mmol/L (ref 0.5–1.9)

## 2024-02-12 LAB — URINALYSIS, W/ REFLEX TO CULTURE (INFECTION SUSPECTED)
Bilirubin Urine: NEGATIVE
Glucose, UA: NEGATIVE mg/dL
Ketones, ur: NEGATIVE mg/dL
Nitrite: NEGATIVE
Protein, ur: 100 mg/dL — AB
Specific Gravity, Urine: 1.015 (ref 1.005–1.030)
pH: 6 (ref 5.0–8.0)

## 2024-02-12 LAB — WET PREP, GENITAL
Sperm: NONE SEEN
Trich, Wet Prep: NONE SEEN
WBC, Wet Prep HPF POC: >=10 — AB (ref <10)

## 2024-02-12 LAB — PROTIME-INR
INR: 1.2 (ref 0.8–1.2)
Prothrombin Time: 15.7 s — ABNORMAL HIGH (ref 11.4–15.2)

## 2024-02-12 LAB — RESP PANEL BY RT-PCR (RSV, FLU A&B, COVID)  RVPGX2
Influenza A by PCR: NEGATIVE
Influenza B by PCR: NEGATIVE
Resp Syncytial Virus by PCR: NEGATIVE
SARS Coronavirus 2 by RT PCR: NEGATIVE

## 2024-02-12 LAB — LIPASE, BLOOD: Lipase: 12 U/L (ref 11–51)

## 2024-02-12 LAB — HCG, SERUM, QUALITATIVE: Preg, Serum: POSITIVE — AB

## 2024-02-12 LAB — HCG, QUANTITATIVE, PREGNANCY: hCG, Beta Chain, Quant, S: 187702 m[IU]/mL — ABNORMAL HIGH (ref <5)

## 2024-02-12 MED ORDER — ALUM & MAG HYDROXIDE-SIMETH 200-200-20 MG/5ML PO SUSP: 30.0000 mL | ORAL | Status: DC | PRN

## 2024-02-12 MED ORDER — OXYCODONE HCL 5 MG PO TABS
5.0000 mg | ORAL_TABLET | ORAL | Status: DC | PRN
  Administered 2024-02-14: 5 mg via ORAL
  Filled 2024-02-12: qty 1

## 2024-02-12 MED ORDER — ALUM & MAG HYDROXIDE-SIMETH 200-200-20 MG/5ML PO SUSP
30.0000 mL | Freq: Once | ORAL | Status: AC
  Administered 2024-02-12: 30 mL via ORAL
  Filled 2024-02-12: qty 30

## 2024-02-12 MED ORDER — ONDANSETRON HCL 4 MG/2ML IJ SOLN: 4.0000 mg | Freq: Four times a day (QID) | INTRAMUSCULAR | Status: DC | PRN

## 2024-02-12 MED ORDER — SODIUM CHLORIDE 0.9% FLUSH: 3.0000 mL | Freq: Two times a day (BID) | INTRAVENOUS | Status: DC

## 2024-02-12 MED ORDER — OXYCODONE HCL 5 MG PO TABS: 10.0000 mg | ORAL_TABLET | ORAL | Status: DC | PRN

## 2024-02-12 MED ORDER — LACTATED RINGERS IV BOLUS (SEPSIS)
1000.0000 mL | Freq: Once | INTRAVENOUS | Status: AC
  Administered 2024-02-12: 1000 mL via INTRAVENOUS

## 2024-02-12 MED ORDER — SODIUM CHLORIDE 0.9 % IV SOLN
2.0000 g | INTRAVENOUS | Status: DC
  Administered 2024-02-13 – 2024-02-14 (×2): 2 g via INTRAVENOUS
  Filled 2024-02-12 (×2): qty 20

## 2024-02-12 MED ORDER — SODIUM CHLORIDE 0.9% FLUSH: 3.0000 mL | INTRAVENOUS | Status: DC | PRN

## 2024-02-12 MED ORDER — METRONIDAZOLE 500 MG/100ML IV SOLN
500.0000 mg | Freq: Once | INTRAVENOUS | Status: AC
  Administered 2024-02-12: 500 mg via INTRAVENOUS
  Filled 2024-02-12: qty 100

## 2024-02-12 MED ORDER — METRONIDAZOLE 500 MG/100ML IV SOLN: 500.0000 mg | Freq: Two times a day (BID) | INTRAVENOUS | Status: DC

## 2024-02-12 MED ORDER — ONDANSETRON 4 MG PO TBDP: 4.0000 mg | ORAL_TABLET | Freq: Four times a day (QID) | ORAL | Status: DC | PRN

## 2024-02-12 MED ORDER — ACETAMINOPHEN 500 MG PO TABS
1000.0000 mg | ORAL_TABLET | Freq: Four times a day (QID) | ORAL | Status: DC | PRN
  Administered 2024-02-13 – 2024-02-14 (×5): 1000 mg via ORAL
  Filled 2024-02-12 (×5): qty 2

## 2024-02-12 MED ORDER — ZOLPIDEM TARTRATE 5 MG PO TABS: 5.0000 mg | ORAL_TABLET | Freq: Every evening | ORAL | Status: DC | PRN

## 2024-02-12 MED ORDER — SODIUM CHLORIDE 0.9 % IV SOLN
2.0000 g | Freq: Once | INTRAVENOUS | Status: AC
  Administered 2024-02-12: 2 g via INTRAVENOUS
  Filled 2024-02-12: qty 20

## 2024-02-12 MED ORDER — PRENATAL MULTIVITAMIN CH
1.0000 | ORAL_TABLET | Freq: Every day | ORAL | Status: DC
  Administered 2024-02-13 – 2024-02-14 (×2): 1 via ORAL
  Filled 2024-02-12 (×2): qty 1

## 2024-02-12 MED ORDER — CLOTRIMAZOLE 1 % VA CREA
1.0000 | TOPICAL_CREAM | Freq: Every day | VAGINAL | Status: DC
  Administered 2024-02-13 – 2024-02-14 (×3): 1 via VAGINAL
  Filled 2024-02-12 (×2): qty 45

## 2024-02-12 MED ORDER — METRONIDAZOLE 500 MG PO TABS
500.0000 mg | ORAL_TABLET | Freq: Two times a day (BID) | ORAL | Status: DC
  Administered 2024-02-13 – 2024-02-15 (×5): 500 mg via ORAL
  Filled 2024-02-12 (×5): qty 1

## 2024-02-12 MED ORDER — DOCUSATE SODIUM 100 MG PO CAPS
100.0000 mg | ORAL_CAPSULE | Freq: Every day | ORAL | Status: DC
  Administered 2024-02-13 – 2024-02-14 (×2): 100 mg via ORAL
  Filled 2024-02-12 (×3): qty 1

## 2024-02-12 MED ORDER — ACETAMINOPHEN 500 MG PO TABS
1000.0000 mg | ORAL_TABLET | Freq: Once | ORAL | Status: AC
  Administered 2024-02-12: 1000 mg via ORAL
  Filled 2024-02-12: qty 2

## 2024-02-12 MED ORDER — CYCLOBENZAPRINE HCL 10 MG PO TABS: 10.0000 mg | ORAL_TABLET | Freq: Three times a day (TID) | ORAL | Status: DC | PRN

## 2024-02-12 MED ORDER — LACTATED RINGERS IV SOLN: INTRAVENOUS | Status: AC

## 2024-02-12 NOTE — Progress Notes (Signed)
 Elink following for sepsis protocol.

## 2024-02-12 NOTE — ED Provider Notes (Signed)
   Accepted handoff at shift change from [facility]. Please see prior provider note for more detail.   Briefly: Patient is 28 y.o. "complaint of fever.  Patient reports is yesterday she has had fever, chills, body ache, headache, left-sided abdominal pain.  She does not endorse any runny nose sneezing or coughing no sore throat no nausea vomiting diarrhea no dysuria or hematuria no vaginal bleeding or vaginal discharge no pain with eating denies alcohol or tobacco use. "  Plan:  - admitting patient for sepsis - possibly from pyelonephritis - patient presents to ED concerned for fever, chills, body aches, headache, and left sided abdominal pain. Physical exam with intermittent CVA tenderness - I did not appreciate this but Dr. Isaiah Marc did appreciate CVA tenderness. Patient also with rigors even after multiple warm towels were applied. Patient febrile at 103.83F which has resolved to 99.7 after tylenol . Patient tachycardic despite 3L of IV fluids.  - hCG elevated at 187,702.  Lipase within normal limits.  CBC with leukocytosis at 11.6.  There is also anemia with hemoglobin at 10.2.  Respiratory panel negative.  UA with many bacteria, small leukocytes, 11-20 WBC, and 6-10 squamous epithelial cells.  Wet prep positive for yeast and clue cells.  Rest of STI panel pending.  Lactic acid within normal limits.  CMP with mild hyponatremia and hypokalemia at 133 and 3.1. - Chest x-ray without acute process.  Renal ultrasound with mild right pelviectasis.  OB US  showing small subchorionic hemorrhage and a viable IUP. - Patient has received 3 L of IV fluids, a GI cocktail, Flagyl , Rocephin , and Tylenol  in ED.  Will also order clotrimazole vaginal cream for the vaginal yeast infection. - I consulted with our OBGYN on-call provider Dr. Thurmon Florida who states that patient needs OBGYN admission for pregnancy and pyelonephritis. They recommended consulting [facility] as patient had c-section with them last year. I  talked with [facility] on call who stated that they do not admit pregnant patient's <[redacted]weeks along. They recommend admission at [facility] since the patient is already here and not established with [facility] for this specific pregnancy.  - Shared this with Dr. Thurmon Florida who agrees to admit patient.      .Critical Care  Performed by: Maricao Bureau, PA-C Authorized by: Blaine Bureau, PA-C   Critical care provider statement:    Critical care time (minutes):  30   Critical care was necessary to treat or prevent imminent or life-threatening deterioration of the following conditions:  Sepsis   Critical care was time spent personally by me on the following activities:  Development of treatment plan with patient or surrogate, discussions with consultants, evaluation of patient's response to treatment, examination of patient, ordering and review of laboratory studies, ordering and review of radiographic studies, ordering and performing treatments and interventions, pulse oximetry, re-evaluation of patient's condition and review of old charts   Care discussed with: admitting provider   Comments:     Sepsis requiring multiple reassessments of patient and multiple consulting phone calls for admission.     Brooks Bureau, [place] 02/12/24 2320    Mordecai Applebaum, MD 02/15/24 1340

## 2024-02-12 NOTE — ED Provider Notes (Signed)
  EMERGENCY DEPARTMENT AT [facility] Provider Note   CSN: [id number] Arrival date & time: 02/12/24  1509     History  Chief Complaint  Patient presents with   Generalized Body Aches    Taylor Colon is a 28 y.o. female.  The history is provided by the patient and medical records. No language interpreter was used.     28 year old female history of seizure presents ER with complaint of fever.  Patient reports is yesterday she has had fever, chills, body ache, headache, left-sided abdominal pain.  She does not endorse any runny nose sneezing or coughing no sore throat no nausea vomiting diarrhea no dysuria or hematuria no vaginal bleeding or vaginal discharge no pain with eating denies alcohol or tobacco use.  No treatment tried.  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  Home Medications Prior to Admission medications   Medication Sig Start Date End Date Taking? Authorizing Provider  amoxicillin -clavulanate (AUGMENTIN ) 875-125 MG tablet Take 1 tablet by mouth every 12 (twelve) hours. 10/12/22   Lucina Sabal, PA-C  LamoTRIgine (LAMICTAL XR) 200 MG TB24 Take 1 tablet by mouth daily. 300 mg    [provider]  levETIRAcetam  (KEPPRA ) 750 MG tablet Take 1 tablet (750 mg total) by mouth 2 (two) times daily. 04/28/23 05/28/23  Curatolo, Adam, DO  misoprostol  (CYTOTEC ) 200 MCG tablet Take 1 tablet by mouth once as a single dose. Place buccally (place tablet between the gum and inner lining of the mouth cheek) as directed 02/25/22     oxyCODONE  (OXY IR/ROXICODONE ) 5 MG immediate release tablet Take 1 tablet (5 mg total) by mouth every 4 (four) hours as needed for up to 5 days. 02/25/22     phentermine  (ADIPEX-P ) 37.5 MG tablet Take 1 tablet (37.5 mg total) by mouth daily. 07/03/21     tinidazole  (TINDAMAX ) 500 MG tablet Take 2 tablets (1,000 mg total) by mouth daily for 5 days. 12/29/21         Allergies    Patient has no known allergies.    Review of Systems   Review of  Systems  All other systems reviewed and are negative.   Physical Exam Updated Vital Signs BP 129/73   Pulse (!) 139   Temp (!) 103.1 F (39.5 C) (Oral)   Resp (!) 28   Ht 5\' 2"  (1.575 m)   Wt 99.8 kg   SpO2 100%   BMI 40.24 kg/m  Physical Exam Vitals and nursing note reviewed.  Constitutional:      General: She is not in acute distress.    Appearance: She is well-developed. She is obese.  HENT:     Head: Atraumatic.  Eyes:     Conjunctiva/sclera: Conjunctivae normal.  Neck:     Comments: No nuchal rigidity Cardiovascular:     Rate and Rhythm: Tachycardia present.     Pulses: Normal pulses.     Heart sounds: Normal heart sounds.  Pulmonary:     Effort: Pulmonary effort is normal.  Abdominal:     Palpations: Abdomen is soft.     Tenderness: There is abdominal tenderness (Mild tenderness to the left side abdomen without focal point tenderness no guarding no rebound tenderness.). There is no right CVA tenderness or left CVA tenderness.  Musculoskeletal:        General: Normal range of motion.     Cervical back: Normal range of motion and neck supple.  Skin:    Findings: No rash.  Neurological:  Mental Status: She is alert. Mental status is at baseline.  Psychiatric:        Mood and Affect: Mood normal.     ED Results / Procedures / Treatments   Labs (all labs ordered are listed, but only abnormal results are displayed) Labs Reviewed  COMPREHENSIVE METABOLIC PANEL WITH GFR - Abnormal; Notable for the following components:      Result Value   Sodium 133 (*)    Potassium 3.1 (*)    CO2 18 (*)    Glucose, Bld 118 (*)    BUN 5 (*)    Anion gap 16 (*)    All other components within normal limits  CBC WITH DIFFERENTIAL/PLATELET - Abnormal; Notable for the following components:   WBC 11.6 (*)    Hemoglobin 10.2 (*)    HCT 31.2 (*)    MCV 70.1 (*)    MCH 22.9 (*)    RDW 18.6 (*)    Neutro Abs 9.2 (*)    Monocytes Absolute 1.6 (*)    All other components  within normal limits  PROTIME-INR - Abnormal; Notable for the following components:   Prothrombin Time 15.7 (*)    All other components within normal limits  URINALYSIS, W/ REFLEX TO CULTURE (INFECTION SUSPECTED) - Abnormal; Notable for the following components:   Hgb urine dipstick TRACE (*)    Protein, ur 100 (*)    Leukocytes,Ua SMALL (*)    Bacteria, UA MANY (*)    All other components within normal limits  HCG, SERUM, QUALITATIVE - Abnormal; Notable for the following components:   Preg, Serum POSITIVE (*)    All other components within normal limits  RESP PANEL BY RT-PCR (RSV, FLU A&B, COVID)  RVPGX2  CULTURE, BLOOD (ROUTINE X 2)  CULTURE, BLOOD (ROUTINE X 2)  WET PREP, GENITAL  LACTIC ACID, PLASMA  LIPASE, BLOOD  HCG, QUANTITATIVE, PREGNANCY  RPR  HIV ANTIBODY (ROUTINE TESTING W REFLEX)  GC/CHLAMYDIA PROBE AMP ([place]) NOT AT [facility]    EKG EKG Interpretation Date/Time:  Sunday Feb 12 2024 16:01:09 EDT Ventricular Rate:  122 PR Interval:  130 QRS Duration:  105 QT Interval:  310 QTC Calculation: 442 R Axis:   11  Text Interpretation: Sinus tachycardia Low voltage, precordial leads Minimal ST depression, inferior leads Baseline wander in lead(s) II III aVR aVF V3 Confirmed by Hiawatha Lout ([id number]) on 02/12/2024 4:13:42 PM  Radiology No results found.  Procedures .Pelvic exam  Date/Time: 02/12/2024 6:30 PM  Performed by: Debbra Fairy, PA-C Authorized by: Debbra Fairy, PA-C  Comments: Chaperone present during exam.  No inguinal lymphadenopathy or inguinal hernia noted.  Normal external genitalia.  No pain with speculum insertion.  Mild vaginal discharge.  Close cervical os free of lesion or rash.  No vaginal bleeding.  On bimanual exam no adnexal tenderness or cervical motion tenderness\   .Critical Care  Performed by: Debbra Fairy, PA-C Authorized by: Debbra Fairy, PA-C   Critical care provider statement:    Critical care time (minutes):  30   Critical care  was time spent personally by me on the following activities:  Development of treatment plan with patient or surrogate, discussions with consultants, evaluation of patient's response to treatment, examination of patient, ordering and review of laboratory studies, ordering and review of radiographic studies, ordering and performing treatments and interventions, pulse oximetry, re-evaluation of patient's condition and review of old charts     Medications Ordered in ED Medications  lactated ringers  infusion ( Intravenous  New Bag/Given 02/12/24 1731)  acetaminophen  (TYLENOL ) tablet 1,000 mg (1,000 mg Oral Given 02/12/24 1522)  lactated ringers  bolus 1,000 mL (0 mLs Intravenous Stopped 02/12/24 1739)    And  lactated ringers  bolus 1,000 mL (0 mLs Intravenous Stopped 02/12/24 1739)    And  lactated ringers  bolus 1,000 mL (1,000 mLs Intravenous New Bag/Given 02/12/24 1726)  cefTRIAXone  (ROCEPHIN ) 2 g in sodium chloride  0.9 % 100 mL IVPB (0 g Intravenous Stopped 02/12/24 1639)  metroNIDAZOLE  (FLAGYL ) IVPB 500 mg (0 mg Intravenous Stopped 02/12/24 1729)    ED Course/ Medical Decision Making/ A&P                                 Medical Decision Making Amount and/or Complexity of Data Reviewed Labs: ordered. Radiology: ordered.  Risk OTC drugs. Prescription drug management.   BP 129/73   Pulse (!) 139   Temp (!) 103.1 F (39.5 C) (Oral)   Resp (!) 28   Ht 5\' 2"  (1.575 m)   Wt 99.8 kg   SpO2 100%   BMI 40.24 kg/m   40:40 PM  28 year old female history of seizure presents ER with complaint of fever.  Patient reports is yesterday she has had fever, chills, body ache, headache, left-sided abdominal pain.  She does not endorse any runny nose sneezing or coughing no sore throat no nausea vomiting diarrhea no dysuria or hematuria no vaginal bleeding or vaginal discharge no pain with eating denies alcohol or tobacco use.  No treatment tried.  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  On exam, patient is  nontoxic in appearance.  Skin is warm to the touch, she is tachycardic and mild tachypneic.  She has a mild tenderness to palpation of the left side of her abdomen without guarding or rebound tenderness.  Lungs otherwise clear, ear nose and throat exam unremarkable.  She is mentating appropriately.  She has no nuchal rigidity.  -Labs ordered, independently viewed and interpreted by me.  Labs remarkable for mild elevation of white count of 11.6 with normal lactic acid.  Patient has a positive pregnancy test.  Will check quantitative hCG.  Patient has negative COVID, flu, RSV test. -The patient was maintained on a cardiac monitor.  I personally viewed and interpreted the cardiac monitored which showed an underlying rhythm of: Sinus tachycardia -Imaging including renal US  and transvaginal US  ordered -This patient presents to the ED for concern of fever, this involves an extensive number of treatment options, and is a complaint that carries with it a high risk of complications and morbidity.  The differential diagnosis includes covid, flu, rsv, pna, uti, pyelonephritis, pid, ectopic pregnancy, colitis, diverticulitis, appendicitis, toa -Co morbidities that complicate the patient evaluation includes seizure -Treatment includes IVF, flagyl , rocephin , tylenol  -Reevaluation of the patient after these medicines showed that the patient improved -PCP office notes or outside notes reviewed -Discussion with attending Dr. Isaiah Marc.  I have sign pt out to oncoming team who will f/u on US  result and determine disposition.  -Escalation to admission/observation considered: dispo pending         Final Clinical Impression(s) / ED Diagnoses Final diagnoses:  None    Rx / DC Orders ED Discharge Orders     None         Debbra Fairy, PA-C 02/12/24 1845    Mordecai Applebaum, MD 02/15/24 1340

## 2024-02-12 NOTE — ED Triage Notes (Signed)
Pt c/o body aches, chills, HA since yesterday

## 2024-02-12 NOTE — ED Notes (Signed)
 Unable to obtain 2nd set of cultures due to unsuccessful IV insertion. Abx started

## 2024-02-12 NOTE — H&P (Signed)
 FACULTY PRACTICE ANTEPARTUM ADMISSION HISTORY AND PHYSICAL NOTE  History of Present Illness: Taylor Colon is a 28 y.o. G3P2002 at [redacted]w[redacted]d dated by sure LMP consistent with ultrasound done 02/12/24, admitted for suspected sepsis in the setting of pyelonephritis. History of two term cesarean sections, last one was in November 2024 and followed by [facility] In [place].  History of seizure disorder, managed on Keppra  500 mg daily. Patient presented to [facility] on 02/12/24 with fever, chills, body ache, headache, left-sided abdominal pain. No respiratory symptoms and denied any dysuria or hematuria, also denied any bleeding.  Evaluation showed temperature of 103.1, sinus tachycardia, with rates up to 130s, LLQ tenderness, WBC 11.6, lactic acid 1.5, negative respiratory panel,  K 3.1, UA concerning for UTI, wet prep showed yeast and bacterial vaginitis.  Sepsis protocol was initiated and she received 3 L of IV fluids, she also received Ceftriaxone  2g IV x 1 and Metronidazole  500 mg IV x 1. Also was started on vaginal Clotrimazole for yeast infection.  Obstetric ultrasound showed [redacted]w[redacted]d IUP with small subchorionic hemorrhage; renal ultrasound showed mild right renal pelviectasis without overt hydronephrosis, no nephrolithiasis. Our service was consulted for management and admission was recommended.  On arrival here at [facility], patient reported having a headache and back pain.  She was noted to have a fever of 101.3 on arrival, had defervesced to 99.7 after Tylenol  administration in the ED.  She denies any current bleeding, dysuria, nausea, vomiting, other GI or GU symptoms or other general symptoms.  Her mother is here at her bedside.  Past Medical History:  Diagnosis Date   Seizure [facility])    Wears glasses     Past Surgical History:  Procedure Laterality Date   CESAREAN SECTION     2018 and 2024    OB History  Gravida Para Term Preterm AB Living  3 2 2   2   SAB IAB Ectopic  Multiple Live Births      2    # Outcome Date GA Lbr Len/2nd Weight Sex Type Anes PTL Lv  3 Current           2 Term 08/11/23     CS-LTranv   LIV  1 Term 07/2017     CS-LTranv   LIV    Social History   Socioeconomic History   Marital status: Single    Spouse name: Not on file   Number of children: Not on file   Years of education: Not on file   Highest education level: Not on file  Occupational History   Not on file  Tobacco Use   Smoking status: Former    Types: E-cigarettes   Smokeless tobacco: Never  Vaping Use   Vaping status: Former  Substance and Sexual Activity   Alcohol use: Yes    Comment: occ   Drug use: No   Sexual activity: Not on file  Other Topics Concern   Not on file  Social History Narrative   Not on file   Social Drivers of Health   Financial Resource Strain: Not on file  Food Insecurity: No Food Insecurity (02/13/2024)   Hunger Vital Sign    Worried About Running Out of Food in the Last Year: Never true    Ran Out of Food in the Last Year: Never true  Transportation Needs: No Transportation Needs (02/13/2024)   PRAPARE - Administrator, Civil Service (Medical): No    Lack of Transportation (Non-Medical):  No  Physical Activity: Not on file  Stress: Not on file  Social Connections: Not on file    History reviewed. No pertinent family history.  No Known Allergies  Medications Prior to Admission  Medication Sig Dispense Refill Last Dose/Taking   levETIRAcetam  (KEPPRA ) 750 MG tablet Take 1 tablet (750 mg total) by mouth 2 (two) times daily. 60 tablet 0     Review of Systems - Negative except what is mentioned in HPI  Vitals:   Patient Vitals for the past 24 hrs:  BP Temp Temp src Pulse Resp SpO2 Height Weight  02/13/24 0140 (!) 122/44 (!) 101.3 F (38.5 C) Oral (!) 150 (!) 21 100 % -- --  02/13/24 0000 (!) 112/59 -- -- (!) 121 -- 99 % -- --  02/12/24 2345 (!) 118/38 -- -- (!) 121 -- 100 % -- --  02/12/24 2330 (!) 123/41 -- --  (!) 129 -- 100 % -- --  02/12/24 2315 135/72 -- -- (!) 135 -- 96 % -- --  02/12/24 2300 (!) 139/59 -- -- (!) 136 -- 97 % -- --  02/12/24 2245 (!) 120/58 -- -- (!) 132 -- 100 % -- --  02/12/24 2241 (!) 112/95 -- -- (!) 128 -- 100 % -- --  02/12/24 2058 -- 99.7 F (37.6 C) Oral -- -- -- -- --  02/12/24 2015 (!) 128/46 -- -- (!) 109 16 100 % -- --  02/12/24 1945 (!) 128/46 -- -- -- -- -- -- --  02/12/24 1900 108/70 -- -- -- -- -- -- --  02/12/24 1715 -- 99.8 F (37.7 C) Oral -- -- -- -- --  02/12/24 1715 -- -- -- (!) 110 (!) 22 100 % -- --  02/12/24 1700 115/63 99.8 F (37.7 C) -- (!) 112 (!) 22 100 % -- --  02/12/24 1645 119/70 -- -- (!) 107 (!) 26 100 % -- --  02/12/24 1630 111/70 -- -- (!) 119 (!) 25 100 % -- --  02/12/24 1615 -- -- -- -- (!) 22 -- -- --  02/12/24 1600 -- -- -- -- (!) 29 -- -- --  02/12/24 1519 129/73 (!) 103.1 F (39.5 C) Oral (!) 139 (!) 28 100 % -- --  02/12/24 1517 -- -- -- -- -- -- 5\' 2"  (1.575 m) 99.8 kg    Physical Examination: CONSTITUTIONAL: Well-developed, well-nourished female in no acute distress.  HENT:  Normocephalic, atraumatic, External right and left ear normal. Oropharynx is clear and moist EYES: Conjunctivae and EOM are normal. Pupils are equal, round, and reactive to light. No scleral icterus.  NECK: Normal range of motion, supple, no masses SKIN: Skin is warm and dry. No rash noted. Not diaphoretic. No erythema. No pallor. NEUROLOGIC: Alert and oriented to person, place, and time. Normal reflexes, muscle tone coordination. No cranial nerve deficit noted. PSYCHIATRIC: Normal mood and affect. Normal behavior. Normal judgment and thought content. CARDIOVASCULAR: Elevated heart rate noted, regular rhythm RESPIRATORY: Effort and breath sounds normal, no problems with respiration noted ABDOMEN: Soft, mild lower abdominal tenderness, no rebound/guarding, nondistended BACK: No discrete CVAT, generalized lower back tenderness to palpation PELVIC:  Deferred MUSCULOSKELETAL: Normal range of motion. No edema and no tenderness. 2+ distal pulses.  Labs:  Results for orders placed or performed during the hospital encounter of 02/12/24 (from the past 24 hours)  Resp panel by RT-PCR (RSV, Flu A&B, Covid) Anterior Nasal Swab   Collection Time: 02/12/24  3:21 PM   Specimen: Anterior Nasal Swab  Result Value Ref Range   SARS Coronavirus 2 by RT PCR NEGATIVE NEGATIVE   Influenza A by PCR NEGATIVE NEGATIVE   Influenza B by PCR NEGATIVE NEGATIVE   Resp Syncytial Virus by PCR NEGATIVE NEGATIVE  Lactic acid, plasma   Collection Time: 02/12/24  3:46 PM  Result Value Ref Range   Lactic Acid, Venous 1.5 0.5 - 1.9 mmol/L  Comprehensive metabolic panel   Collection Time: 02/12/24  3:46 PM  Result Value Ref Range   Sodium 133 (L) 135 - 145 mmol/L   Potassium 3.1 (L) 3.5 - 5.1 mmol/L   Chloride 100 98 - 111 mmol/L   CO2 18 (L) 22 - 32 mmol/L   Glucose, Bld 118 (H) 70 - 99 mg/dL   BUN 5 (L) 6 - 20 mg/dL   Creatinine, Ser 1.61 0.44 - 1.00 mg/dL   Calcium 9.2 8.9 - 09.6 mg/dL   Total Protein 7.5 6.5 - 8.1 g/dL   Albumin 3.8 3.5 - 5.0 g/dL   AST 28 15 - 41 U/L   ALT 20 0 - 44 U/L   Alkaline Phosphatase 102 38 - 126 U/L   Total Bilirubin 0.4 0.0 - 1.2 mg/dL   GFR, Estimated >04 >54 mL/min   Anion gap 16 (H) 5 - 15  CBC with Differential   Collection Time: 02/12/24  3:46 PM  Result Value Ref Range   WBC 11.6 (H) 4.0 - 10.5 K/uL   RBC 4.45 3.87 - 5.11 MIL/uL   Hemoglobin 10.2 (L) 12.0 - 15.0 g/dL   HCT 09.8 (L) 11.9 - 14.7 %   MCV 70.1 (L) 80.0 - 100.0 fL   MCH 22.9 (L) 26.0 - 34.0 pg   MCHC 32.7 30.0 - 36.0 g/dL   RDW 82.9 (H) 56.2 - 13.0 %   Platelets 233 150 - 400 K/uL   nRBC 0.0 0.0 - 0.2 %   Neutrophils Relative % 79 %   Neutro Abs 9.2 (H) 1.7 - 7.7 K/uL   Lymphocytes Relative 7 %   Lymphs Abs 0.8 0.7 - 4.0 K/uL   Monocytes Relative 14 %   Monocytes Absolute 1.6 (H) 0.1 - 1.0 K/uL   Eosinophils Relative 0 %   Eosinophils  Absolute 0.0 0.0 - 0.5 K/uL   Basophils Relative 0 %   Basophils Absolute 0.0 0.0 - 0.1 K/uL   Immature Granulocytes 0 %   Abs Immature Granulocytes 0.05 0.00 - 0.07 K/uL  Protime-INR   Collection Time: 02/12/24  3:46 PM  Result Value Ref Range   Prothrombin Time 15.7 (H) 11.4 - 15.2 seconds   INR 1.2 0.8 - 1.2  Lipase, blood   Collection Time: 02/12/24  3:46 PM  Result Value Ref Range   Lipase 12 11 - 51 U/L  hCG, serum, qualitative   Collection Time: 02/12/24  3:46 PM  Result Value Ref Range   Preg, Serum POSITIVE (A) NEGATIVE  hCG, quantitative, pregnancy   Collection Time: 02/12/24  3:46 PM  Result Value Ref Range   hCG, Beta Chain, Quant, S 187,702 (H) <5 mIU/mL  Urinalysis, w/ Reflex to Culture (Infection Suspected) -Urine, Clean Catch   Collection Time: 02/12/24  5:45 PM  Result Value Ref Range   Specimen Source URINE, CLEAN CATCH    Color, Urine YELLOW YELLOW   APPearance CLEAR CLEAR   Specific Gravity, Urine 1.015 1.005 - 1.030   pH 6.0 5.0 - 8.0   Glucose, UA NEGATIVE NEGATIVE mg/dL   Hgb urine dipstick TRACE (A)  NEGATIVE   Bilirubin Urine NEGATIVE NEGATIVE   Ketones, ur NEGATIVE NEGATIVE mg/dL   Protein, ur 295 (A) NEGATIVE mg/dL   Nitrite NEGATIVE NEGATIVE   Leukocytes,Ua SMALL (A) NEGATIVE   Squamous Epithelial / HPF 6-10 0 - 5 /HPF   WBC, UA 11-20 0 - 5 WBC/hpf   RBC / HPF 0-5 0 - 5 RBC/hpf   Bacteria, UA MANY (A) NONE SEEN  Wet prep, genital   Collection Time: 02/12/24  6:01 PM  Result Value Ref Range   Yeast Wet Prep HPF POC PRESENT (A) NONE SEEN   Trich, Wet Prep NONE SEEN NONE SEEN   Clue Cells Wet Prep HPF POC PRESENT (A) NONE SEEN   WBC, Wet Prep HPF POC >=10 (A) <10   Sperm NONE SEEN     Imaging Studies: US  Renal Result Date: 02/12/2024 CLINICAL DATA:  Initial evaluation for acute pelvic pain, fever, early pregnancy. EXAM: RENAL / URINARY TRACT ULTRASOUND COMPLETE COMPARISON:  Comparison made with the ultrasound performed at the same time.  FINDINGS: Right Kidney: Renal measurements: 13.7 x 6.8 x 6.8 cm = volume: 332.0 mL. Renal echogenicity within normal limits. No nephrolithiasis. Mild pelviectasis without overt hydronephrosis. No focal renal mass. Left Kidney: Renal measurements: 14.5 x 6.5 x 6.7 cm = volume: 330.0 mL. Renal echogenicity within normal limits. No nephrolithiasis or hydronephrosis. No focal renal mass. Bladder: Appears normal for degree of bladder distention. Other: Gravid uterus partially visualized within the pelvis, evaluated on corresponding OB ultrasound performed at the same time. IMPRESSION: 1. Mild right renal pelviectasis without overt hydronephrosis. 2. Otherwise unremarkable and normal renal ultrasound. No nephrolithiasis. Electronically Signed   By: Virgia Griffins M.D.   On: 02/12/2024 20:21   US  OB LESS THAN 14 WEEKS WITH OB TRANSVAGINAL Result Date: 02/12/2024 CLINICAL DATA:  Initial evaluation for acute pelvic pain, early pregnancy. EXAM: OBSTETRIC <14 WK US  AND TRANSVAGINAL OB US  TECHNIQUE: Both transabdominal and transvaginal ultrasound examinations were performed for complete evaluation of the gestation as well as the maternal uterus, adnexal regions, and pelvic cul-de-sac. Transvaginal technique was performed to assess early pregnancy. COMPARISON:  None Available. FINDINGS: Intrauterine gestational sac: Single Yolk sac:  Present Embryo:  Present Cardiac Activity: Present Heart Rate: 176 bpm CRL:   24.8 mm   9 w 2 d                  US  EDC: 09/14/2024 Subchorionic hemorrhage: Small hemorrhage measuring up to 2 cm without mass effect. Small volume fluid seen within the adjacent endometrial cavity, partially extending towards the C-section defect. Maternal uterus/adnexae: Right ovary within normal limits. Left ovary not seen. No adnexal mass or free fluid. IMPRESSION: 1. Single viable IUP, estimated gestational age [redacted] weeks and 2 days by crown-rump length, with ultrasound EDC of 09/14/2024. 2. Small  subchorionic hemorrhage as above. 3. No other acute maternal uterine or adnexal abnormality. Electronically Signed   By: Virgia Griffins M.D.   On: 02/12/2024 20:18   DG Chest Port 1 View Result Date: 02/12/2024 CLINICAL DATA:  Questionable sepsis - evaluate for abnormality EXAM: PORTABLE CHEST 1 VIEW COMPARISON:  None Available. FINDINGS: The heart and mediastinal contours are within normal limits. Low lung volumes. No focal consolidation. No pulmonary edema. No pleural effusion. No pneumothorax. No acute osseous abnormality. IMPRESSION: Low lung volumes with no active disease. Electronically Signed   By: Morgane  Naveau M.D.   On: 02/12/2024 18:42    Assessment and Plan: Patient Active Problem List  Diagnosis Date Noted   Hypokalemia 02/13/2024   Pyelonephritis complicating pregnancy, first trimester 02/12/2024   [redacted] weeks gestation of pregnancy 02/12/2024   Vaginitis complicating current pregnancy, first trimester 02/12/2024   Seizure disorder (HCC) 02/12/2024   Pyelonephritis affecting pregnancy in first trimester 02/12/2024   Admit to Antenatal Unit - [facility] Ceftriaxone  2 g IV daily continued, next dose due around 1600 today Clotrimazole and Metronidazole  ordered for her yeast and bacterial vaginitis Potassium repletion ordered, added to her IV fluids for now, reevaluate BMP 5/13 morning. Was already on Sepsis protocol in the ER, continue IV fluids and antibiotics. Monitor temperature and heart rate, already noted to have sinus tachycardia on EKG. Pain medications ordered as needed. Keppra  500 mg daily ordered for her seizure disorder, also ordered Folic acid 1 mg daily in addition to her prenatal vitamins. Follow up urine and blood cultures, and will choose appropriate oral antibiotic to transition to once she is stable and getting ready to be discharged.   Patient is aware that the goal is to remain in house until she has been afebrile for at least 48-72 hours prior to considering  discharge to home; she will also need to complete 14 day course of oral antibiotics and be on suppression for rest of the pregnancy. Continue close observation and routine antenatal care    Lenoard Rad, MD, FACOG Attending Obstetrician & Gynecologist Faculty Practice, [facility]

## 2024-02-12 NOTE — ED Notes (Signed)
 [facility] called for [email or web address]

## 2024-02-13 ENCOUNTER — Encounter: Payer: Self-pay | Admitting: Family Medicine

## 2024-02-13 DIAGNOSIS — E876 Hypokalemia: Secondary | ICD-10-CM | POA: Diagnosis present

## 2024-02-13 DIAGNOSIS — E871 Hypo-osmolality and hyponatremia: Secondary | ICD-10-CM | POA: Diagnosis present

## 2024-02-13 DIAGNOSIS — Z3A10 10 weeks gestation of pregnancy: Secondary | ICD-10-CM | POA: Diagnosis not present

## 2024-02-13 DIAGNOSIS — O99281 Endocrine, nutritional and metabolic diseases complicating pregnancy, first trimester: Secondary | ICD-10-CM | POA: Diagnosis present

## 2024-02-13 DIAGNOSIS — O98811 Other maternal infectious and parasitic diseases complicating pregnancy, first trimester: Secondary | ICD-10-CM | POA: Diagnosis present

## 2024-02-13 DIAGNOSIS — Z1152 Encounter for screening for COVID-19: Secondary | ICD-10-CM | POA: Diagnosis not present

## 2024-02-13 DIAGNOSIS — Z87891 Personal history of nicotine dependence: Secondary | ICD-10-CM | POA: Diagnosis not present

## 2024-02-13 DIAGNOSIS — O99351 Diseases of the nervous system complicating pregnancy, first trimester: Secondary | ICD-10-CM | POA: Diagnosis present

## 2024-02-13 DIAGNOSIS — O2301 Infections of kidney in pregnancy, first trimester: Principal | ICD-10-CM

## 2024-02-13 DIAGNOSIS — B3731 Acute candidiasis of vulva and vagina: Secondary | ICD-10-CM | POA: Diagnosis present

## 2024-02-13 DIAGNOSIS — O99011 Anemia complicating pregnancy, first trimester: Secondary | ICD-10-CM | POA: Diagnosis present

## 2024-02-13 DIAGNOSIS — G40909 Epilepsy, unspecified, not intractable, without status epilepticus: Secondary | ICD-10-CM | POA: Diagnosis present

## 2024-02-13 LAB — CBC WITH DIFFERENTIAL/PLATELET
Abs Immature Granulocytes: 0.05 10*3/uL (ref 0.00–0.07)
Basophils Absolute: 0 10*3/uL (ref 0.0–0.1)
Basophils Relative: 0 %
Eosinophils Absolute: 0 10*3/uL (ref 0.0–0.5)
Eosinophils Relative: 0 %
HCT: 26.2 % — ABNORMAL LOW (ref 36.0–46.0)
Hemoglobin: 8.2 g/dL — ABNORMAL LOW (ref 12.0–15.0)
Immature Granulocytes: 1 %
Lymphocytes Relative: 11 %
Lymphs Abs: 1.1 10*3/uL (ref 0.7–4.0)
MCH: 22.8 pg — ABNORMAL LOW (ref 26.0–34.0)
MCHC: 31.3 g/dL (ref 30.0–36.0)
MCV: 72.8 fL — ABNORMAL LOW (ref 80.0–100.0)
Monocytes Absolute: 1.4 10*3/uL — ABNORMAL HIGH (ref 0.1–1.0)
Monocytes Relative: 15 %
Neutro Abs: 7.2 10*3/uL (ref 1.7–7.7)
Neutrophils Relative %: 73 %
Platelets: 207 10*3/uL (ref 150–400)
RBC: 3.6 MIL/uL — ABNORMAL LOW (ref 3.87–5.11)
RDW: 18.7 % — ABNORMAL HIGH (ref 11.5–15.5)
WBC: 9.8 10*3/uL (ref 4.0–10.5)
nRBC: 0 % (ref 0.0–0.2)

## 2024-02-13 LAB — GC/CHLAMYDIA PROBE AMP (~~LOC~~) NOT AT ARMC
Chlamydia: NEGATIVE
Comment: NEGATIVE
Comment: NORMAL
Neisseria Gonorrhea: NEGATIVE

## 2024-02-13 LAB — HIV ANTIBODY (ROUTINE TESTING W REFLEX): HIV Screen 4th Generation wRfx: NONREACTIVE

## 2024-02-13 LAB — TYPE AND SCREEN
ABO/RH(D): A POS
Antibody Screen: NEGATIVE

## 2024-02-13 MED ORDER — FOLIC ACID 1 MG PO TABS
1.0000 mg | ORAL_TABLET | Freq: Every day | ORAL | Status: DC
  Administered 2024-02-13 – 2024-02-15 (×3): 1 mg via ORAL
  Filled 2024-02-13 (×3): qty 1

## 2024-02-13 MED ORDER — POTASSIUM CHLORIDE 2 MEQ/ML IV SOLN
INTRAVENOUS | Status: AC
  Filled 2024-02-13 (×4): qty 1000

## 2024-02-13 MED ORDER — LEVETIRACETAM 500 MG PO TABS
500.0000 mg | ORAL_TABLET | Freq: Every day | ORAL | Status: DC
  Administered 2024-02-13 – 2024-02-15 (×3): 500 mg via ORAL
  Filled 2024-02-13 (×3): qty 1

## 2024-02-13 NOTE — Progress Notes (Signed)
    Faculty Practice OB/GYN Attending Note  Subjective:  Patient reports she is feeling better, less pain reported.  No other complaints.   Admitted on 02/12/2024 for Pyelonephritis complicating pregnancy, first trimester.    Objective:  Blood pressure (!) 109/52, pulse 98, temperature 97.7 F (36.5 C), temperature source Oral, resp. rate 18, height 5\' 2"  (1.575 m), weight 99.8 kg, last menstrual period 12/05/2023, SpO2 98%, unknown if currently breastfeeding. Gen: NAD HENT: Normocephalic, atraumatic Lungs: Normal respiratory effort Heart: Regular rate noted Abdomen: soft, NT Cervix: Deferred Ext: 2+ DTRs, no edema, no cyanosis, negative Homan's sign   Labs:    Latest Ref Rng & Units 02/13/2024    4:11 AM 02/12/2024    3:46 PM 04/28/2023   10:02 PM  CBC  WBC 4.0 - 10.5 K/uL 9.8  11.6    Hemoglobin 12.0 - 15.0 g/dL 8.2  63.8  75.6   Hematocrit 36.0 - 46.0 % 26.2  31.2  35.0   Platelets 150 - 400 K/uL 207  233        Latest Ref Rng & Units 02/12/2024    3:46 PM 04/28/2023   10:02 PM 01/15/2022   12:35 PM  BMP  Glucose 70 - 99 mg/dL 433  79  295   BUN 6 - 20 mg/dL 5  <3  5   Creatinine 1.88 - 1.00 mg/dL 4.16  6.06  3.01   Sodium 135 - 145 mmol/L 133  138  135   Potassium 3.5 - 5.1 mmol/L 3.1  3.7  3.7   Chloride 98 - 111 mmol/L 100  105  105   CO2 22 - 32 mmol/L 18   23   Calcium 8.9 - 10.3 mg/dL 9.2   9.0     Assessment & Plan:  28 y.o. G3P2002 at [redacted]w[redacted]d admitted for pyelonephritis.   - Temperature is normal, last fever was 101.3 at 5/12 0140.  Continue Ceftriaxone .  - WBC decreased to 9.8, this is reassuring.  Concerned about Hgb 8.2, she has no bleeding and was noted to have a small subchorionic hemorrhage on ultrasound.  Will recheck tomorrow morning. If still low, will start iron therapy. - Potassium being repleted, will check tomorrow morning - Reiterated plan for observation until at least 48 hours afebrile - Continue close observation.   Lenoard Rad, MD,  FACOG Obstetrician & Gynecologist, [facility] for [employer], [facility]

## 2024-02-14 LAB — CBC WITH DIFFERENTIAL/PLATELET
Abs Immature Granulocytes: 0.03 10*3/uL (ref 0.00–0.07)
Basophils Absolute: 0 10*3/uL (ref 0.0–0.1)
Basophils Relative: 0 %
Eosinophils Absolute: 0 10*3/uL (ref 0.0–0.5)
Eosinophils Relative: 1 %
HCT: 26.8 % — ABNORMAL LOW (ref 36.0–46.0)
Hemoglobin: 8.4 g/dL — ABNORMAL LOW (ref 12.0–15.0)
Immature Granulocytes: 0 %
Lymphocytes Relative: 16 %
Lymphs Abs: 1.3 10*3/uL (ref 0.7–4.0)
MCH: 22.5 pg — ABNORMAL LOW (ref 26.0–34.0)
MCHC: 31.3 g/dL (ref 30.0–36.0)
MCV: 71.8 fL — ABNORMAL LOW (ref 80.0–100.0)
Monocytes Absolute: 1 10*3/uL (ref 0.1–1.0)
Monocytes Relative: 13 %
Neutro Abs: 5.7 10*3/uL (ref 1.7–7.7)
Neutrophils Relative %: 70 %
Platelets: 221 10*3/uL (ref 150–400)
RBC: 3.73 MIL/uL — ABNORMAL LOW (ref 3.87–5.11)
RDW: 19 % — ABNORMAL HIGH (ref 11.5–15.5)
WBC: 8 10*3/uL (ref 4.0–10.5)
nRBC: 0 % (ref 0.0–0.2)

## 2024-02-14 LAB — BASIC METABOLIC PANEL WITH GFR
Anion gap: 8 (ref 5–15)
BUN: <5 mg/dL — ABNORMAL LOW (ref 6–20)
CO2: 20 mmol/L — ABNORMAL LOW (ref 22–32)
Calcium: 8.4 mg/dL — ABNORMAL LOW (ref 8.9–10.3)
Chloride: 108 mmol/L (ref 98–111)
Creatinine, Ser: 0.63 mg/dL (ref 0.44–1.00)
GFR, Estimated: >60 mL/min (ref >60)
Glucose, Bld: 112 mg/dL — ABNORMAL HIGH (ref 70–99)
Potassium: 3.7 mmol/L (ref 3.5–5.1)
Sodium: 136 mmol/L (ref 135–145)

## 2024-02-14 LAB — RPR: RPR Ser Ql: NONREACTIVE

## 2024-02-14 MED ORDER — SODIUM CHLORIDE 0.9 % IV SOLN
500.0000 mg | Freq: Once | INTRAVENOUS | Status: AC
  Administered 2024-02-14: 500 mg via INTRAVENOUS
  Filled 2024-02-14: qty 25

## 2024-02-14 MED ORDER — LACTATED RINGERS IV BOLUS
500.0000 mL | Freq: Once | INTRAVENOUS | Status: AC
  Administered 2024-02-14: 500 mL via INTRAVENOUS

## 2024-02-14 MED ORDER — LACTATED RINGERS IV SOLN
INTRAVENOUS | Status: AC
Start: 2024-02-14 — End: 2024-02-15

## 2024-02-14 NOTE — Progress Notes (Signed)
 FACULTY PRACTICE ANTEPARTUM PROGRESS NOTE  Taylor Colon is a 28 y.o. G3P2002 at [redacted]w[redacted]d who is admitted for pyelonephritis complicating pregnancy.  Estimated Date of Delivery: 09/10/24 Fetal presentation is unsure.  Length of Stay:  1 Days. Admitted 02/12/2024  Subjective: Pt denies any fever or chills.  She denies any current flank pain.   She denies uterine contractions, denies bleeding and leaking of fluid per vagina.  Vitals:  Blood pressure (!) 103/50, pulse (!) 103, temperature (P) 99.3 F (37.4 C), temperature source (P) Oral, resp. rate (P) 18, height 5\' 2"  (1.575 m), weight 99.8 kg, last menstrual period 12/05/2023, SpO2 (P) 100%, unknown if currently breastfeeding. Physical Examination: CONSTITUTIONAL: Well-developed, obese, well-nourished female in no acute distress.  HENT:  Normocephalic, atraumatic, External right and left ear normal. Oropharynx is clear and moist EYES: Conjunctivae and EOM are normal.  NECK: Normal range of motion, supple, no masses. SKIN: Skin is warm and dry. No rash noted. Not diaphoretic. No erythema. No pallor. NEUROLGIC: Alert and oriented to person, place, and time. Normal reflexes, muscle tone coordination. No cranial nerve deficit noted. PSYCHIATRIC: Normal mood and affect. Normal behavior. Normal judgment and thought content. CARDIOVASCULAR: Normal heart rate noted, regular rhythm RESPIRATORY: Effort and breath sounds normal, no problems with respiration noted MUSCULOSKELETAL: Normal range of motion. No edema and no tenderness. ABDOMEN: Soft, nontender, nondistended, gravid. CERVIX: deferred  Fetal monitoring: FHR: NR  Results for orders placed or performed during the hospital encounter of 02/12/24 (from the past 48 hours)  Resp panel by RT-PCR (RSV, Flu A&B, Covid) Anterior Nasal Swab     Status: None   Collection Time: 02/12/24  3:21 PM   Specimen: Anterior Nasal Swab  Result Value Ref Range   SARS Coronavirus 2 by RT PCR NEGATIVE NEGATIVE     Comment: (NOTE) SARS-CoV-2 target nucleic acids are NOT DETECTED.  The SARS-CoV-2 RNA is generally detectable in upper respiratory specimens during the acute phase of infection. The lowest concentration of SARS-CoV-2 viral copies this assay can detect is 138 copies/mL. A negative result does not preclude SARS-Cov-2 infection and should not be used as the sole basis for treatment or other patient management decisions. A negative result may occur with  improper specimen collection/handling, submission of specimen other than nasopharyngeal swab, presence of viral mutation(s) within the areas targeted by this assay, and inadequate number of viral copies(<138 copies/mL). A negative result must be combined with clinical observations, patient history, and epidemiological information. The expected result is Negative.  Fact Sheet for Patients:  BloggerCourse.com  Fact Sheet for Healthcare Providers:  SeriousBroker.it  This test is no t yet approved or cleared by the [place]  FDA and  has been authorized for detection and/or diagnosis of SARS-CoV-2 by FDA under an Emergency Use Authorization (EUA). This EUA will remain  in effect (meaning this test can be used) for the duration of the COVID-19 declaration under Section 564(b)(1) of the Act, 21 U.S.C.section 360bbb-3(b)(1), unless the authorization is terminated  or revoked sooner.       Influenza A by PCR NEGATIVE NEGATIVE   Influenza B by PCR NEGATIVE NEGATIVE    Comment: (NOTE) The Xpert Xpress SARS-CoV-2/FLU/RSV plus assay is intended as an aid in the diagnosis of influenza from Nasopharyngeal swab specimens and should not be used as a sole basis for treatment. Nasal washings and aspirates are unacceptable for Xpert Xpress SARS-CoV-2/FLU/RSV testing.  Fact Sheet for Patients: BloggerCourse.com  Fact Sheet for Healthcare  Providers: SeriousBroker.it  This test is  not yet approved or cleared by the [place]  FDA and has been authorized for detection and/or diagnosis of SARS-CoV-2 by FDA under an Emergency Use Authorization (EUA). This EUA will remain in effect (meaning this test can be used) for the duration of the COVID-19 declaration under Section 564(b)(1) of the Act, 21 U.S.C. section 360bbb-3(b)(1), unless the authorization is terminated or revoked.     Resp Syncytial Virus by PCR NEGATIVE NEGATIVE    Comment: (NOTE) Fact Sheet for Patients: BloggerCourse.com  Fact Sheet for Healthcare Providers: SeriousBroker.it  This test is not yet approved or cleared by the [place]  FDA and has been authorized for detection and/or diagnosis of SARS-CoV-2 by FDA under an Emergency Use Authorization (EUA). This EUA will remain in effect (meaning this test can be used) for the duration of the COVID-19 declaration under Section 564(b)(1) of the Act, 21 U.S.C. section 360bbb-3(b)(1), unless the authorization is terminated or revoked.  Performed at [facility], [address] [place], [place] [address]   Lactic acid, plasma     Status: None   Collection Time: 02/12/24  3:46 PM  Result Value Ref Range   Lactic Acid, Venous 1.5 0.5 - 1.9 mmol/L    Comment: Performed at [facility], [address] [place], [place] [address]  Comprehensive metabolic panel     Status: Abnormal   Collection Time: 02/12/24  3:46 PM  Result Value Ref Range   Sodium 133 (L) 135 - 145 mmol/L   Potassium 3.1 (L) 3.5 - 5.1 mmol/L   Chloride 100 98 - 111 mmol/L   CO2 18 (L) 22 - 32 mmol/L   Glucose, Bld 118 (H) 70 - 99 mg/dL    Comment: Glucose reference range applies only to samples taken after fasting for at least 8 hours.   BUN 5 (L) 6 - 20 mg/dL   Creatinine, Ser 0.98 0.44 - 1.00 mg/dL   Calcium 9.2 8.9 - 11.9 mg/dL    Total Protein 7.5 6.5 - 8.1 g/dL   Albumin 3.8 3.5 - 5.0 g/dL   AST 28 15 - 41 U/L   ALT 20 0 - 44 U/L   Alkaline Phosphatase 102 38 - 126 U/L   Total Bilirubin 0.4 0.0 - 1.2 mg/dL   GFR, Estimated >14 >78 mL/min    Comment: (NOTE) Calculated using the CKD-EPI Creatinine Equation (2021)    Anion gap 16 (H) 5 - 15    Comment: Performed at [facility], [address] [place], [place] [address]  CBC with Differential     Status: Abnormal   Collection Time: 02/12/24  3:46 PM  Result Value Ref Range   WBC 11.6 (H) 4.0 - 10.5 K/uL   RBC 4.45 3.87 - 5.11 MIL/uL   Hemoglobin 10.2 (L) 12.0 - 15.0 g/dL   HCT 13.0 (L) 86.5 - 78.4 %   MCV 70.1 (L) 80.0 - 100.0 fL   MCH 22.9 (L) 26.0 - 34.0 pg   MCHC 32.7 30.0 - 36.0 g/dL   RDW 69.6 (H) 29.5 - 28.4 %   Platelets 233 150 - 400 K/uL    Comment: REPEATED TO VERIFY   nRBC 0.0 0.0 - 0.2 %   Neutrophils Relative % 79 %   Neutro Abs 9.2 (H) 1.7 - 7.7 K/uL   Lymphocytes Relative 7 %   Lymphs Abs 0.8 0.7 - 4.0 K/uL   Monocytes Relative 14 %   Monocytes Absolute 1.6 (H) 0.1 -  1.0 K/uL   Eosinophils Relative 0 %   Eosinophils Absolute 0.0 0.0 - 0.5 K/uL   Basophils Relative 0 %   Basophils Absolute 0.0 0.0 - 0.1 K/uL   Immature Granulocytes 0 %   Abs Immature Granulocytes 0.05 0.00 - 0.07 K/uL    Comment: Performed at [facility], [address] [place], [place] [address]  Protime-INR     Status: Abnormal   Collection Time: 02/12/24  3:46 PM  Result Value Ref Range   Prothrombin Time 15.7 (H) 11.4 - 15.2 seconds   INR 1.2 0.8 - 1.2    Comment: (NOTE) INR goal varies based on device and disease states. Performed at [facility], [address] [place], [place] [address]   Lipase, blood     Status: None   Collection Time: 02/12/24  3:46 PM  Result Value Ref Range   Lipase 12 11 - 51 U/L    Comment: Performed at [facility], [address] [place], [place] [address]  hCG, serum,  qualitative     Status: Abnormal   Collection Time: 02/12/24  3:46 PM  Result Value Ref Range   Preg, Serum POSITIVE (A) NEGATIVE    Comment:        THE SENSITIVITY OF THIS METHODOLOGY IS >10 mIU/mL. Performed at [facility], [address] [place], [place] [address]   hCG, quantitative, pregnancy     Status: Abnormal   Collection Time: 02/12/24  3:46 PM  Result Value Ref Range   hCG, Beta Chain, Quant, S 187,702 (H) <5 mIU/mL    Comment:          GEST. AGE      CONC.  (mIU/mL)   <=1 WEEK        5 - 50     2 WEEKS       50 - 500     3 WEEKS       100 - 10,000     4 WEEKS     1,000 - 30,000     5 WEEKS     3,500 - 115,000   6-8 WEEKS     12,000 - 270,000    12 WEEKS     15,000 - 220,000        FEMALE AND NON-PREGNANT FEMALE:     LESS THAN 5 mIU/mL Performed at [facility], [address] [place], [place] [address]   Blood Culture (routine x 2)     Status: None (Preliminary result)   Collection Time: 02/12/24  4:06 PM   Specimen: BLOOD  Result Value Ref Range   Specimen Description      BLOOD RIGHT ANTECUBITAL Performed at [facility], [address] [place], [place] [address]    Special Requests      BOTTLES DRAWN AEROBIC AND ANAEROBIC Blood Culture adequate volume Performed at [facility], [address] [place], [place] [address]    Culture      NO GROWTH < 12 HOURS Performed at [facility], [address] [place], [place] [address]    Report Status PENDING   Blood Culture (routine x 2)     Status: None (Preliminary result)   Collection Time: 02/12/24  4:30 PM   Specimen: BLOOD RIGHT HAND  Result Value Ref Range   Specimen Description      BLOOD RIGHT HAND Performed  at [facility], [address] [place], [place] [address]    Special Requests      BOTTLES DRAWN AEROBIC AND ANAEROBIC Blood Culture results may not be optimal due to an inadequate volume of blood received in culture  bottles Performed at [facility], [address] [place], [place] [address]    Culture      NO GROWTH < 12 HOURS Performed at [facility], [address], [place], [place] [address]    Report Status PENDING   Urinalysis, w/ Reflex to Culture (Infection Suspected) -Urine, Clean Catch     Status: Abnormal   Collection Time: 02/12/24  5:45 PM  Result Value Ref Range   Specimen Source URINE, CLEAN CATCH    Color, Urine YELLOW YELLOW   APPearance CLEAR CLEAR   Specific Gravity, Urine 1.015 1.005 - 1.030   pH 6.0 5.0 - 8.0   Glucose, UA NEGATIVE NEGATIVE mg/dL   Hgb urine dipstick TRACE (A) NEGATIVE   Bilirubin Urine NEGATIVE NEGATIVE   Ketones, ur NEGATIVE NEGATIVE mg/dL   Protein, ur 914 (A) NEGATIVE mg/dL   Nitrite NEGATIVE NEGATIVE   Leukocytes,Ua SMALL (A) NEGATIVE   Squamous Epithelial / HPF 6-10 0 - 5 /HPF   WBC, UA 11-20 0 - 5 WBC/hpf    Comment: Reflex urine culture not performed if WBC <=10, OR if Squamous epithelial cells >5. If Squamous epithelial cells >5, suggest recollection.   RBC / HPF 0-5 0 - 5 RBC/hpf   Bacteria, UA MANY (A) NONE SEEN    Comment: Performed at [facility], [address] [place], [place] [address]  GC/Chlamydia probe amp     Status: None   Collection Time: 02/12/24  6:01 PM  Result Value Ref Range   Neisseria Gonorrhea Negative    Chlamydia Negative    Comment Normal Reference Ranger Chlamydia - Negative    Comment      Normal Reference Range Neisseria Gonorrhea - Negative  Wet prep, genital     Status: Abnormal   Collection Time: 02/12/24  6:01 PM  Result Value Ref Range   Yeast Wet Prep HPF POC PRESENT (A) NONE SEEN   Trich, Wet Prep NONE SEEN NONE SEEN   Clue Cells Wet Prep HPF POC PRESENT (A) NONE SEEN   WBC, Wet Prep HPF POC >=10 (A) <10   Sperm NONE SEEN     Comment: Performed at [facility], [address] [place], [place] [address]  RPR     Status: None   Collection Time: 02/12/24  6:16  PM  Result Value Ref Range   RPR Ser Ql NON REACTIVE NON REACTIVE    Comment: Performed at [facility], [address], [place], [place] [address]  HIV Antibody (routine testing w rflx)     Status: None   Collection Time: 02/12/24  6:26 PM  Result Value Ref Range   HIV Screen 4th Generation wRfx Non Reactive Non Reactive    Comment: Performed at [facility], [address], [place], [place] [address]  CBC with Differential/Platelet     Status: Abnormal   Collection Time: 02/13/24  4:11 AM  Result Value Ref Range   WBC 9.8 4.0 - 10.5 K/uL   RBC 3.60 (L) 3.87 - 5.11 MIL/uL   Hemoglobin 8.2 (L) 12.0 - 15.0 g/dL    Comment: Reticulocyte Hemoglobin testing may be clinically indicated, consider ordering  this additional test [id number]    HCT 26.2 (L) 36.0 - 46.0 %   MCV 72.8 (L) 80.0 - 100.0 fL   MCH 22.8 (L) 26.0 - 34.0 pg   MCHC 31.3 30.0 - 36.0 g/dL   RDW 54.0 (H) 98.1 - 19.1 %   Platelets 207 150 - 400 K/uL   nRBC 0.0 0.0 - 0.2 %   Neutrophils Relative % 73 %   Neutro Abs 7.2 1.7 - 7.7 K/uL   Lymphocytes Relative 11 %   Lymphs Abs 1.1 0.7 - 4.0 K/uL   Monocytes Relative 15 %   Monocytes Absolute 1.4 (H) 0.1 - 1.0 K/uL   Eosinophils Relative 0 %   Eosinophils Absolute 0.0 0.0 - 0.5 K/uL   Basophils Relative 0 %   Basophils Absolute 0.0 0.0 - 0.1 K/uL   Immature Granulocytes 1 %   Abs Immature Granulocytes 0.05 0.00 - 0.07 K/uL    Comment: Performed at [facility], [address] [place], [place] [address]  Type and screen [facility]     Status: None   Collection Time: 02/13/24  4:11 AM  Result Value Ref Range   ABO/RH(D) A POS    Antibody Screen NEG    Sample Expiration      02/16/2024,2359 Performed at [address] [facility], [address] [place], [place] [address]   CBC with Differential/Platelet     Status: Abnormal   Collection Time: 02/14/24  5:11 AM  Result Value Ref Range   WBC 8.0 4.0 - 10.5 K/uL   RBC 3.73 (L) 3.87 - 5.11  MIL/uL   Hemoglobin 8.4 (L) 12.0 - 15.0 g/dL    Comment: Reticulocyte Hemoglobin testing may be clinically indicated, consider ordering this additional test [id number]    HCT 26.8 (L) 36.0 - 46.0 %   MCV 71.8 (L) 80.0 - 100.0 fL   MCH 22.5 (L) 26.0 - 34.0 pg   MCHC 31.3 30.0 - 36.0 g/dL   RDW 96.2 (H) 95.2 - 84.1 %   Platelets 221 150 - 400 K/uL   nRBC 0.0 0.0 - 0.2 %   Neutrophils Relative % 70 %   Neutro Abs 5.7 1.7 - 7.7 K/uL   Lymphocytes Relative 16 %   Lymphs Abs 1.3 0.7 - 4.0 K/uL   Monocytes Relative 13 %   Monocytes Absolute 1.0 0.1 - 1.0 K/uL   Eosinophils Relative 1 %   Eosinophils Absolute 0.0 0.0 - 0.5 K/uL   Basophils Relative 0 %   Basophils Absolute 0.0 0.0 - 0.1 K/uL   Immature Granulocytes 0 %   Abs Immature Granulocytes 0.03 0.00 - 0.07 K/uL    Comment: Performed at [facility], [address] [place], [place] [address]  Basic metabolic panel     Status: Abnormal   Collection Time: 02/14/24  5:11 AM  Result Value Ref Range   Sodium 136 135 - 145 mmol/L   Potassium 3.7 3.5 - 5.1 mmol/L   Chloride 108 98 - 111 mmol/L   CO2 20 (L) 22 - 32 mmol/L   Glucose, Bld 112 (H) 70 - 99 mg/dL    Comment: Glucose reference range applies only to samples taken after fasting for at least 8 hours.   BUN <5 (L) 6 - 20 mg/dL   Creatinine, Ser 1.02 0.44 - 1.00 mg/dL   Calcium 8.4 (L) 8.9 - 10.3 mg/dL   GFR, Estimated >72 >53 mL/min    Comment: (NOTE) Calculated  using the CKD-EPI Creatinine Equation (2021)    Anion gap 8 5 - 15    Comment: Performed at [facility], [address], [place], [place] [address]    I have reviewed the patient's current medications.  ASSESSMENT: Principal Problem:   Pyelonephritis complicating pregnancy, first trimester Active Problems:   [redacted] weeks gestation of pregnancy   Vaginitis complicating current pregnancy, first trimester   Seizure disorder (HCC)   Pyelonephritis affecting pregnancy in first trimester    Hypokalemia   PLAN: Pyelonephritis clinically improved, no fevers noted Would continue abx until 5/14 and reassess for discharge with oral antibiotic course.  Hypokalemia: resolved with IV repletion  Anemia of pregnancy.  Recheck today still showed a hemoglobin of 8, discussed iron infusion with patient.  Order placed.   Continue routine antenatal care.   Avie Boeck, MD [facility] Faculty Attending, Center for [facility] 02/14/2024 7:28 AM

## 2024-02-14 NOTE — Plan of Care (Signed)
  Problem: Education: Goal: Knowledge of General Education information will improve Description: Including pain rating scale, medication(s)/side effects and non-pharmacologic comfort measures Outcome: Progressing   Problem: Health Behavior/Discharge Planning: Goal: Ability to manage health-related needs will improve Outcome: Progressing   Problem: Clinical Measurements: Goal: Ability to maintain clinical measurements within normal limits will improve Outcome: Progressing Goal: Will remain free from infection Outcome: Progressing Goal: Diagnostic test results will improve Outcome: Progressing Goal: Respiratory complications will improve Outcome: Progressing Goal: Cardiovascular complication will be avoided Outcome: Progressing   Problem: Activity: Goal: Risk for activity intolerance will decrease Outcome: Progressing   Problem: Nutrition: Goal: Adequate nutrition will be maintained Outcome: Progressing   Problem: Coping: Goal: Level of anxiety will decrease Outcome: Progressing   Problem: Elimination: Goal: Will not experience complications related to bowel motility Outcome: Progressing Goal: Will not experience complications related to urinary retention Outcome: Progressing   Problem: Pain Managment: Goal: General experience of comfort will improve and/or be controlled Outcome: Progressing   Problem: Safety: Goal: Ability to remain free from injury will improve Outcome: Progressing   Problem: Skin Integrity: Goal: Risk for impaired skin integrity will decrease Outcome: Progressing   Problem: Education: Goal: Knowledge of disease or condition will improve Outcome: Progressing Goal: Knowledge of the prescribed therapeutic regimen will improve Outcome: Progressing Goal: Individualized Educational Video(s) Outcome: Progressing   Problem: Clinical Measurements: Goal: Complications related to the disease process, condition or treatment will be avoided or  minimized Outcome: Progressing   Problem: Education: Goal: Knowledge of disease or condition will improve Outcome: Progressing Goal: Knowledge of the prescribed therapeutic regimen will improve Outcome: Progressing Goal: Individualized Educational Video(s) Outcome: Progressing   Problem: Clinical Measurements: Goal: Complications related to the disease process, condition or treatment will be avoided or minimized Outcome: Progressing   Problem: Urinary Elimination: Goal: Signs and symptoms of infection will decrease Outcome: Progressing

## 2024-02-15 ENCOUNTER — Other Ambulatory Visit: Payer: Self-pay | Admitting: Obstetrics & Gynecology

## 2024-02-15 ENCOUNTER — Other Ambulatory Visit (HOSPITAL_COMMUNITY): Payer: Self-pay

## 2024-02-15 LAB — CBC WITH DIFFERENTIAL/PLATELET
Abs Immature Granulocytes: 0.04 10*3/uL (ref 0.00–0.07)
Basophils Absolute: 0 10*3/uL (ref 0.0–0.1)
Basophils Relative: 0 %
Eosinophils Absolute: 0.1 10*3/uL (ref 0.0–0.5)
Eosinophils Relative: 1 %
HCT: 26.4 % — ABNORMAL LOW (ref 36.0–46.0)
Hemoglobin: 8.3 g/dL — ABNORMAL LOW (ref 12.0–15.0)
Immature Granulocytes: 1 %
Lymphocytes Relative: 21 %
Lymphs Abs: 1.1 10*3/uL (ref 0.7–4.0)
MCH: 22.5 pg — ABNORMAL LOW (ref 26.0–34.0)
MCHC: 31.4 g/dL (ref 30.0–36.0)
MCV: 71.5 fL — ABNORMAL LOW (ref 80.0–100.0)
Monocytes Absolute: 0.6 10*3/uL (ref 0.1–1.0)
Monocytes Relative: 12 %
Neutro Abs: 3.3 10*3/uL (ref 1.7–7.7)
Neutrophils Relative %: 65 %
Platelets: 240 10*3/uL (ref 150–400)
RBC: 3.69 MIL/uL — ABNORMAL LOW (ref 3.87–5.11)
RDW: 18.9 % — ABNORMAL HIGH (ref 11.5–15.5)
WBC: 5.1 10*3/uL (ref 4.0–10.5)
nRBC: 0 % (ref 0.0–0.2)

## 2024-02-15 MED ORDER — METRONIDAZOLE 500 MG PO TABS: 500.0000 mg | ORAL_TABLET | Freq: Two times a day (BID) | ORAL | 0 refills | Status: DC

## 2024-02-15 MED ORDER — METRONIDAZOLE 500 MG PO TABS
500.0000 mg | ORAL_TABLET | Freq: Two times a day (BID) | ORAL | 0 refills | Status: DC
  Filled 2024-02-15: qty 8, 4d supply, fill #0

## 2024-02-15 MED ORDER — SULFAMETHOXAZOLE-TRIMETHOPRIM 800-160 MG PO TABS: 1.0000 | ORAL_TABLET | Freq: Two times a day (BID) | ORAL | 1 refills | Status: DC

## 2024-02-15 MED ORDER — CEFADROXIL 500 MG PO CAPS
1000.0000 mg | ORAL_CAPSULE | Freq: Two times a day (BID) | ORAL | 0 refills | Status: AC
  Filled 2024-02-15: qty 28, 7d supply, fill #0

## 2024-02-15 MED ORDER — LEVETIRACETAM 500 MG PO TABS
500.0000 mg | ORAL_TABLET | Freq: Every day | ORAL | 1 refills | Status: AC
  Filled 2024-02-15: qty 60, 60d supply, fill #0

## 2024-02-15 NOTE — Progress Notes (Signed)
Discharge instructions and prescriptions given to pt. Discussed signs and symptoms to report to the MD, upcoming appointments, and meds. Pt verbalizes understanding and has no questions or concerns at this time. Pt discharged home from hospital in stable condition. 

## 2024-02-15 NOTE — Discharge Summary (Addendum)
 Physician Discharge Summary  Patient ID: Taylor Colon MRN: [id number] DOB/AGE: 1996/04/21 28 y.o.  Admit date: 02/12/2024 Discharge date: 02/15/2024  Admission Diagnoses:Pyelonephritis [redacted]w[redacted]d   Discharge Diagnoses:  Principal Problem:   Pyelonephritis complicating pregnancy, first trimester Active Problems:   [redacted] weeks gestation of pregnancy   Vaginitis complicating current pregnancy, first trimester   Seizure disorder (HCC)   Pyelonephritis affecting pregnancy in first trimester   Hypokalemia   Discharged Condition: good  Hospital Course: from H&P Taylor Colon is a 28 y.o. G3P2002 at [redacted]w[redacted]d dated by sure LMP consistent with ultrasound done 02/12/24, admitted for suspected sepsis in the setting of pyelonephritis. History of two term cesarean sections, last one was in November 2024 and followed [facility] [place].  History of seizure disorder, managed on Keppra  500 mg daily. Patient presented to [facility] on 02/12/24 with fever, chills, body ache, headache, left-sided abdominal pain. No respiratory symptoms and denied any dysuria or hematuria, also denied any bleeding.  Evaluation showed temperature of 103.1, sinus tachycardia, with rates up to 130s, LLQ tenderness, WBC 11.6, lactic acid 1.5, negative respiratory panel,  K 3.1, UA concerning for UTI, wet prep showed yeast and bacterial vaginitis.  Sepsis protocol was initiated and she received 3 L of IV fluids, she also received Ceftriaxone  2g IV x 1 and Metronidazole  500 mg IV x 1. Also was started on vaginal Clotrimazole for yeast infection.  Obstetric ultrasound showed [redacted]w[redacted]d IUP with small subchorionic hemorrhage; renal ultrasound showed mild right renal pelviectasis without overt hydronephrosis, no nephrolithiasis. Our service was consulted for management and admission was recommended.   On arrival here at [facility], patient reported having a headache and back pain.  She was noted to have a fever of 101.3 on arrival,  had defervesced to 99.7 after Tylenol  administration in the ED.  She denies any current bleeding, dysuria, nausea, vomiting, other GI or GU symptoms or other general symptoms.  Her mother is here at her bedside.  She was given IV Rocephin  and she remained afebrile and her back pain resolved. No urine culture result available, blood cultures no growth.  Consults: None  Significant Diagnostic Studies: labs: CBC    Component Value Date/Time   WBC 5.1 02/15/2024 0623   RBC 3.69 (L) 02/15/2024 0623   HGB 8.3 (L) 02/15/2024 0623   HCT 26.4 (L) 02/15/2024 0623   PLT 240 02/15/2024 0623   MCV 71.5 (L) 02/15/2024 0623   MCH 22.5 (L) 02/15/2024 0623   MCHC 31.4 02/15/2024 0623   RDW 18.9 (H) 02/15/2024 0623   LYMPHSABS 1.1 02/15/2024 0623   MONOABS 0.6 02/15/2024 0623   EOSABS 0.1 02/15/2024 0623   BASOSABS 0.0 02/15/2024 0623     Treatments: IV hydration and antibiotics: ceftriaxone   Discharge Exam: Blood pressure 127/64, pulse 94, temperature 98.1 F (36.7 C), temperature source Oral, resp. rate 18, height 5\' 2"  (1.575 m), weight 99.8 kg, last menstrual period 12/05/2023, SpO2 100%, unknown if currently breastfeeding. General appearance: alert, cooperative, and no distress Resp: effort normal Cardio: regular rate and rhythm GI: soft, non-tender; bowel sounds normal; no masses,  no organomegaly and No CVAT  Disposition: Discharge disposition: 01-Home or Self Care       Discharge Instructions     Discharge patient   Complete by: As directed    Discharge disposition: 01-Home or Self Care   Discharge patient date: 02/15/2024      Allergies as of 02/15/2024   No Known Allergies  Medication List     TAKE these medications    cefadroxil 500 MG capsule Commonly known as: DURICEF Take 2 capsules (1,000 mg total) by mouth 2 (two) times daily for 14 doses.   levETIRAcetam  750 MG tablet Commonly known as: Keppra  Take 1 tablet (750 mg total) by mouth 2 (two) times  daily. What changed: Another medication with the same name was added. Make sure you understand how and when to take each.   levETIRAcetam  500 MG tablet Commonly known as: KEPPRA  Take 1 tablet (500 mg total) by mouth daily. Start taking on: Feb 16, 2024 What changed: You were already taking a medication with the same name, and this prescription was added. Make sure you understand how and when to take each.   metroNIDAZOLE  500 MG tablet Commonly known as: FLAGYL  Take 1 tablet (500 mg total) by mouth 2 (two) times daily.        Follow-up Information     [facility] Follow up in 2 week(s).   Specialty: Obstetrics and Gynecology Why: patient is considering establishing [facility] Contact information: [address] [place]  [address] [phone]                Signed: Onnie Colon 02/15/2024, 11:24 AM

## 2024-02-17 LAB — CULTURE, BLOOD (ROUTINE X 2)
Culture: NO GROWTH
Culture: NO GROWTH
Special Requests: ADEQUATE

## 2024-04-03 ENCOUNTER — Ambulatory Visit

## 2024-04-03 ENCOUNTER — Other Ambulatory Visit: Payer: Self-pay

## 2024-04-03 DIAGNOSIS — O0992 Supervision of high risk pregnancy, unspecified, second trimester: Secondary | ICD-10-CM

## 2024-04-03 DIAGNOSIS — O0990 Supervision of high risk pregnancy, unspecified, unspecified trimester: Secondary | ICD-10-CM | POA: Insufficient documentation

## 2024-04-03 NOTE — Progress Notes (Signed)
 New OB Intake  I explained I am completing New OB Intake today. We discussed EDD of 09/10/2024, by Last Menstrual Period. Pt is H5E7987. I reviewed her allergies, medications and Medical/Surgical/OB history.    Patient Active Problem List   Diagnosis Date Noted   Hypokalemia 02/13/2024   Pyelonephritis complicating pregnancy, first trimester 02/12/2024   [redacted] weeks gestation of pregnancy 02/12/2024   Vaginitis complicating current pregnancy, first trimester 02/12/2024   Seizure disorder (HCC) 02/12/2024   Pyelonephritis affecting pregnancy in first trimester 02/12/2024    Concerns addressed today  Patient informed that the ultrasound is considered a limited obstetric ultrasound and is not intended to be a complete ultrasound exam.  Patient also informed that the ultrasound is not being completed with the intent of assessing for fetal or placental anomalies or any pelvic abnormalities. Explained that the purpose of today's ultrasound is to assess for viability.  Patient acknowledges the purpose of the exam and the limitations of the study.     Delivery Plans Plans to deliver at [facility] Discussed the nature of our practice with multiple providers including residents and students. Due to the size of the practice, the delivering provider may not be the same as those providing prenatal care.   MyChart/Babyscripts MyChart access verified. I explained pt will have some visits in office and some virtually. Babyscripts app discussed and ordered.   Blood Pressure Cuff Blood pressure cuff discussedDiscussed to be used for virtual visits and or if needed BP checks weekly.  Anatomy US  Explained first scheduled US  will be around 19 weeks.   Last Pap No results found for: DIAGPAP  First visit review I reviewed new OB appt with patient. Explained pt will be seen by Dr. Barbra at first visit. Discussed [place] genetic screening with patient . Routine prenatal labs were obtained at  [facility].    Erminio DELENA Rumps, [place] 04/03/2024  10:25 AM

## 2024-04-11 ENCOUNTER — Ambulatory Visit: Admitting: Family Medicine

## 2024-04-11 ENCOUNTER — Other Ambulatory Visit (HOSPITAL_COMMUNITY)
Admission: RE | Admit: 2024-04-11 | Discharge: 2024-04-11 | Disposition: A | Discharge status: Home / Self Care | Source: Ambulatory Visit | Attending: Family Medicine | Admitting: Family Medicine

## 2024-04-11 VITALS — BP 105/69 | HR 90 | Wt 242.1 lb

## 2024-04-11 DIAGNOSIS — Z6841 Body Mass Index (BMI) 40.0 and over, adult: Secondary | ICD-10-CM | POA: Diagnosis not present

## 2024-04-11 DIAGNOSIS — Z3A18 18 weeks gestation of pregnancy: Secondary | ICD-10-CM

## 2024-04-11 DIAGNOSIS — O0992 Supervision of high risk pregnancy, unspecified, second trimester: Secondary | ICD-10-CM

## 2024-04-11 DIAGNOSIS — O2301 Infections of kidney in pregnancy, first trimester: Secondary | ICD-10-CM

## 2024-04-11 DIAGNOSIS — Z8632 Personal history of gestational diabetes: Secondary | ICD-10-CM

## 2024-04-11 DIAGNOSIS — D509 Iron deficiency anemia, unspecified: Secondary | ICD-10-CM | POA: Insufficient documentation

## 2024-04-11 DIAGNOSIS — G40909 Epilepsy, unspecified, not intractable, without status epilepticus: Secondary | ICD-10-CM

## 2024-04-11 DIAGNOSIS — O09899 Supervision of other high risk pregnancies, unspecified trimester: Secondary | ICD-10-CM

## 2024-04-11 DIAGNOSIS — O99019 Anemia complicating pregnancy, unspecified trimester: Secondary | ICD-10-CM

## 2024-04-11 MED ORDER — NITROFURANTOIN MONOHYD MACRO 100 MG PO CAPS: 100.0000 mg | ORAL_CAPSULE | Freq: Every day | ORAL | 3 refills | Status: DC

## 2024-04-11 MED ORDER — FLUCONAZOLE 150 MG PO TABS: 150.0000 mg | ORAL_TABLET | Freq: Once | ORAL | 0 refills | Status: AC

## 2024-04-11 MED ORDER — FOLIC ACID 1 MG PO TABS
1.0000 mg | ORAL_TABLET | Freq: Every day | ORAL | 10 refills | Status: AC
Start: 2024-04-11 — End: ?

## 2024-04-11 MED ORDER — ASPIRIN 81 MG PO TBEC: 81.0000 mg | DELAYED_RELEASE_TABLET | Freq: Every day | ORAL | 2 refills | Status: DC

## 2024-04-11 NOTE — Progress Notes (Signed)
 Subjective:  Taylor Colon is a H5E7987 [redacted]w[redacted]d being seen today for her first obstetrical visit.  Her obstetrical history is significant for history of 2 prior cesarean deliveries, history of pyelonephritis this pregnancy. During admission, was discovered to be quite anemic. Did have GDM during last pregnancy. Has h/o seizure disorder and is on keppra . Patient does intend to breast feed. Pregnancy history fully reviewed.  Patient reports no complaints.  BP 105/69   Pulse 90   Wt 242 lb 1.9 oz (109.8 kg)   LMP 12/05/2023 (Exact Date)   BMI 44.28 kg/m   HISTORY: OB History  Gravida Para Term Preterm AB Living  4 2 2  1 2   SAB IAB Ectopic Multiple Live Births  1    2    # Outcome Date GA Lbr Len/2nd Weight Sex Type Anes PTL Lv  4 Current           3 Term 08/11/23 [redacted]w[redacted]d  8 lb 6 oz (3.799 kg) M CS-LTranv Spinal N LIV  2 Term 07/29/17 [redacted]w[redacted]d   F CS-LTranv   LIV     Complications: Fetal Intolerance  1 SAB             Past Medical History:  Diagnosis Date   Gestational diabetes 2024   Seizure [facility])    Wears glasses     Past Surgical History:  Procedure Laterality Date   CESAREAN SECTION     2018 and 2024    Family History  Problem Relation Age of Onset   Healthy Mother    Diabetes Father    Healthy Maternal Grandmother    Hypertension Maternal Grandfather      Exam  BP 105/69   Pulse 90   Wt 242 lb 1.9 oz (109.8 kg)   LMP 12/05/2023 (Exact Date)   BMI 44.28 kg/m   Chaperone present during exam  CONSTITUTIONAL: Well-developed, well-nourished female in no acute distress.  HENT:  Normocephalic, atraumatic, External right and left ear normal. Oropharynx is clear and moist EYES: Conjunctivae and EOM are normal. Pupils are equal, round, and reactive to light. No scleral icterus.  NECK: Normal range of motion, supple, no masses.  Normal thyroid.  CARDIOVASCULAR: Normal heart rate noted, regular rhythm RESPIRATORY: Clear to auscultation bilaterally. Effort and breath  sounds normal, no problems with respiration noted. BREASTS: Symmetric in size. No masses, skin changes, nipple drainage, or lymphadenopathy. ABDOMEN: Soft, normal bowel sounds, no distention noted.  No tenderness, rebound or guarding.  PELVIC: Normal appearing external genitalia; normal appearing vaginal mucosa and cervix. No abnormal discharge noted. Normal uterine size, no other palpable masses, no uterine or adnexal tenderness. MUSCULOSKELETAL: Normal range of motion. No tenderness.  No cyanosis, clubbing, or edema.  2+ distal pulses. SKIN: Skin is warm and dry. No rash noted. Not diaphoretic. No erythema. No pallor. NEUROLOGIC: Alert and oriented to person, place, and time. Normal reflexes, muscle tone coordination. No cranial nerve deficit noted. PSYCHIATRIC: Normal mood and affect. Normal behavior. Normal judgment and thought content.    Assessment:    Pregnancy: H5E7987 Patient Active Problem List   Diagnosis Date Noted   Supervision of high risk pregnancy, antepartum 04/03/2024   Hypokalemia 02/13/2024   Pyelonephritis complicating pregnancy, first trimester 02/12/2024   [redacted] weeks gestation of pregnancy 02/12/2024   Vaginitis complicating current pregnancy, first trimester 02/12/2024   Seizure disorder (HCC) 02/12/2024   Pyelonephritis affecting pregnancy in first trimester 02/12/2024      Plan:   1. Supervision of high risk  pregnancy in second trimester (Primary) FHT normal - Culture, OB Urine - AFP TETRA - CHL AMB BABYSCRIPTS OPT IN - Hemoglobin A1c - Protein / creatinine ratio, urine - Comprehensive metabolic panel with GFR - TSH - Iron , TIBC and Ferritin Panel - Vitamin B12 - Cytology - PAP( [place]bie)  2. Pyelonephritis affecting pregnancy in first trimester Macrobid  for ppx.  3. Seizure disorder (HCC) On keppra  - last seizure was >4 years ago.  4. BMI 40.0-44.9, adult (HCC) ASA 81mg  recommended - Protein / creatinine ratio, urine - Comprehensive  metabolic panel with GFR - TSH  5. Antepartum anemia Will check iron  studies but will likely need iron  infusions - Folate - Iron , TIBC and Ferritin Panel - Vitamin B12  6. History of diet controlled gestational diabetes mellitus (GDM) Check HgA1c. May need earlier 2hr GTT.    Initial labs obtained Continue prenatal vitamins Reviewed n/v relief measures and warning s/s to report Reviewed recommended weight gain based on pre-gravid BMI Encouraged well-balanced diet Genetic & carrier screening discussed: requests AFP,  Ultrasound discussed; fetal survey: requested CCNC completed> form faxed if has or is planning to apply for medicaid The nature of [facility] [employer] with multiple MDs and other Advanced Practice Providers was explained to patient; also emphasized that fellows, residents, and students are part of our team.  Problem list reviewed and updated. 75% of 30 min visit spent on counseling and coordination of care.     Leelynd Maldonado J Rolfe Hartsell 04/11/2024

## 2024-04-13 LAB — CULTURE, OB URINE

## 2024-04-13 LAB — URINE CULTURE, OB REFLEX: Organism ID, Bacteria: NO GROWTH

## 2024-04-14 LAB — AFP TETRA
DIA Mom Value: 1.66
DIA Value (EIA): 202.43 pg/mL
DSR (By Age)    1 IN: 699
DSR (Second Trimester) 1 IN: 8322
Gestational Age: 18 wk
MSAFP Mom: 1.67
MSAFP: 54.3 ng/mL
MSHCG Mom: 1.79
MSHCG: 39858 m[IU]/mL
Maternal Age At EDD: 30 a
Osb Risk: 1757
T18 (By Age): 1:2723 {titer}
Test Results:: NEGATIVE
Weight: 242 [lb_av]
uE3 Mom: 1.36
uE3 Value: 1.95 ng/mL

## 2024-04-14 LAB — COMPREHENSIVE METABOLIC PANEL WITH GFR
ALT: 6 IU/L (ref 0–32)
AST: 11 IU/L (ref 0–40)
Albumin: 4 g/dL (ref 4.0–5.0)
Alkaline Phosphatase: 66 IU/L (ref 44–121)
BUN/Creatinine Ratio: 8 — ABNORMAL LOW (ref 9–23)
BUN: 5 mg/dL — ABNORMAL LOW (ref 6–20)
Bilirubin Total: <0.2 mg/dL (ref 0.0–1.2)
CO2: 16 mmol/L — ABNORMAL LOW (ref 20–29)
Calcium: 9.3 mg/dL (ref 8.7–10.2)
Chloride: 104 mmol/L (ref 96–106)
Creatinine, Ser: 0.6 mg/dL (ref 0.57–1.00)
Globulin, Total: 2.9 g/dL (ref 1.5–4.5)
Glucose: 87 mg/dL (ref 70–99)
Potassium: 4.4 mmol/L (ref 3.5–5.2)
Sodium: 137 mmol/L (ref 134–144)
Total Protein: 6.9 g/dL (ref 6.0–8.5)
eGFR: 125 mL/min/1.73 (ref >59)

## 2024-04-14 LAB — HEMOGLOBIN A1C
Est. average glucose Bld gHb Est-mCnc: 105 mg/dL
Hgb A1c MFr Bld: 5.3 % (ref 4.8–5.6)

## 2024-04-14 LAB — IRON,TIBC AND FERRITIN PANEL
Ferritin: 14 ng/mL — ABNORMAL LOW (ref 15–150)
Iron Saturation: 8 % — CL (ref 15–55)
Iron: 37 ug/dL (ref 27–159)
Total Iron Binding Capacity: 460 ug/dL — ABNORMAL HIGH (ref 250–450)
UIBC: 423 ug/dL (ref 131–425)

## 2024-04-14 LAB — PROTEIN / CREATININE RATIO, URINE
Creatinine, Urine: 128.6 mg/dL
Protein, Ur: 10.3 mg/dL
Protein/Creat Ratio: 80 mg/g{creat} (ref 0–200)

## 2024-04-14 LAB — VITAMIN B12: Vitamin B-12: 286 pg/mL (ref 232–1245)

## 2024-04-14 LAB — FOLATE: Folate: 10.8 ng/mL (ref >3.0)

## 2024-04-14 LAB — TSH: TSH: 1.21 u[IU]/mL (ref 0.450–4.500)

## 2024-04-16 ENCOUNTER — Ambulatory Visit: Payer: Self-pay | Admitting: Family Medicine

## 2024-04-16 LAB — CYTOLOGY - PAP
Chlamydia: NEGATIVE
Comment: NEGATIVE
Comment: NORMAL
Diagnosis: NEGATIVE
Neisseria Gonorrhea: NEGATIVE

## 2024-04-17 ENCOUNTER — Telehealth: Payer: Self-pay

## 2024-04-17 NOTE — Telephone Encounter (Addendum)
 Dr. Barbra, patient will be scheduled as soon as possible.  Auth Submission: NO AUTH NEEDED Site of care: Site of care: [facility] Payer: [employer] state health with [facility] medicaid Medication & CPT/J Code(s) submitted: Venofer  (Iron  Sucrose) J1756 Diagnosis Code:  Route of submission (phone, fax, portal):  Phone # Fax # Auth type: Buy/Bill PB Units/visits requested: 300mg  x 3 doses Reference number:  Approval from: 04/17/24 to 08/18/24

## 2024-05-02 ENCOUNTER — Other Ambulatory Visit: Payer: Self-pay | Admitting: *Deleted

## 2024-05-02 ENCOUNTER — Ambulatory Visit: Attending: Obstetrics and Gynecology | Admitting: Obstetrics and Gynecology

## 2024-05-02 ENCOUNTER — Ambulatory Visit

## 2024-05-02 VITALS — BP 120/72 | HR 98

## 2024-05-02 DIAGNOSIS — Z6841 Body Mass Index (BMI) 40.0 and over, adult: Secondary | ICD-10-CM

## 2024-05-02 DIAGNOSIS — O99352 Diseases of the nervous system complicating pregnancy, second trimester: Secondary | ICD-10-CM | POA: Insufficient documentation

## 2024-05-02 DIAGNOSIS — O09292 Supervision of pregnancy with other poor reproductive or obstetric history, second trimester: Secondary | ICD-10-CM | POA: Insufficient documentation

## 2024-05-02 DIAGNOSIS — O0992 Supervision of high risk pregnancy, unspecified, second trimester: Secondary | ICD-10-CM

## 2024-05-02 DIAGNOSIS — Z3A21 21 weeks gestation of pregnancy: Secondary | ICD-10-CM

## 2024-05-02 DIAGNOSIS — G40909 Epilepsy, unspecified, not intractable, without status epilepticus: Secondary | ICD-10-CM | POA: Diagnosis not present

## 2024-05-02 DIAGNOSIS — O09892 Supervision of other high risk pregnancies, second trimester: Secondary | ICD-10-CM | POA: Diagnosis not present

## 2024-05-02 DIAGNOSIS — E669 Obesity, unspecified: Secondary | ICD-10-CM | POA: Diagnosis not present

## 2024-05-02 DIAGNOSIS — O34219 Maternal care for unspecified type scar from previous cesarean delivery: Secondary | ICD-10-CM | POA: Insufficient documentation

## 2024-05-02 DIAGNOSIS — Z363 Encounter for antenatal screening for malformations: Secondary | ICD-10-CM | POA: Diagnosis not present

## 2024-05-02 DIAGNOSIS — O99212 Obesity complicating pregnancy, second trimester: Secondary | ICD-10-CM | POA: Diagnosis not present

## 2024-05-02 DIAGNOSIS — Z8632 Personal history of gestational diabetes: Secondary | ICD-10-CM

## 2024-05-02 DIAGNOSIS — Z362 Encounter for other antenatal screening follow-up: Secondary | ICD-10-CM

## 2024-05-02 DIAGNOSIS — E66813 Obesity, class 3: Secondary | ICD-10-CM | POA: Insufficient documentation

## 2024-05-02 DIAGNOSIS — O09899 Supervision of other high risk pregnancies, unspecified trimester: Secondary | ICD-10-CM

## 2024-05-02 NOTE — Progress Notes (Signed)
 Maternal-Fetal Medicine Consultation Name: Taylor Colon MRN: [id number]  G4 E7987 at 21w 2d gestation.  Patient is here for fetal anatomy scan.  High risk problems include: - Epilepsy complicating pregnancy.  Patient takes Keppra  and reports she has not had seizures for several years.  She has not seen a neurologist recently. - Pregravid BMI 43. - Short interval between pregnancies. - Previous 2 cesarean deliveries.  Her first cesarean was performed because of nonreassuring fetal heart trace and she opted to have elective cesarean delivery and a second pregnancy. - Acute pyelonephritis.  Patient was recently discharged from hospital after treatment for acute pyelonephritis and takes nitrofurantoin  uroprophylaxis.  MSAFP screening showed low risk for open neural tube defects.  Patient had opted not to screen for fetal aneuploidies.  Ultrasound We performed a fetal anatomical survey.  Amniotic fluid is normal good fetal activity seen.  Fetal biometry is consistent with the previously established dates.  No markers of aneuploidies or obvious fetal structural defects are seen.  Fetal anatomical survey could not be completed because of fetal position.  Placenta is posterior and there is no evidence of previa or placenta accreta spectrum. Patient understands the limitations of ultrasound in detecting fetal anomalies.  Her concerns include Epilepsy in pregnancy I reassured the patient that Keppra  is safe in pregnancy and is not associated with increased congenital malformations.  The doses need to be adjusted in pregnancy because of hemodilution.  I recommend drug levels in every trimester. I encouraged her to see her neurologist.  And well-controlled epilepsy, we do not expect any pregnancy adverse outcomes.  Previous cesarean delivery I counseled the patient that repeat cesarean deliveries increase the risks of placenta previa and or placenta accreta spectrum.  Patient is undecided about bilateral  tubal ligation.  Pregravid BMI Grade 3 obesity is independently associated with increased risk of stillbirth (2.5- to 3-fold), but the absolute risk is very small.  I discussed protocol of weekly antenatal testing from [redacted] weeks gestation until delivery. In addition, there are limitations in the resolution of ultrasound images and fetal anomalies may be missed.  Weight loss is not advised in pregnancy and a weight gain of 11 to 20 pounds is reasonable.  Short Interpregnancy Interval It is defined as the time interval (18 to 24 months) between the end of previous pregnancy and the beginning of next pregnancy. The impact of short pregnancy interval on the outcome of subsequent pregnancy is uncertain. Some studies have shown that congenital anomalies, preterm delivery, and fetal growth restriction rates are increased in pregnancies with short interpregnancy interval.  However, it is not supported by other reports. Overall, we should expect good pregnancy outcomes if there are no other high-risk factors.  I encouraged the patient to continue uroprophylaxis with nitrofurantoin  to prevent recurrence of acute pyelonephritis.  Recommendations -An appointment was made for her to return in 5 weeks for fetal growth assessment and completion of fetal anatomy. - Fetal growth assessments every 4 weeks till delivery. - Weekly BPP from [redacted] weeks gestation until delivery.    Consultation including face-to-face (more than 50%) counseling 45 minutes.

## 2024-05-10 ENCOUNTER — Ambulatory Visit: Admitting: Family Medicine

## 2024-05-10 VITALS — BP 117/70 | HR 124 | Wt 248.0 lb

## 2024-05-10 DIAGNOSIS — D509 Iron deficiency anemia, unspecified: Secondary | ICD-10-CM

## 2024-05-10 DIAGNOSIS — Z6841 Body Mass Index (BMI) 40.0 and over, adult: Secondary | ICD-10-CM

## 2024-05-10 DIAGNOSIS — O0990 Supervision of high risk pregnancy, unspecified, unspecified trimester: Secondary | ICD-10-CM

## 2024-05-10 DIAGNOSIS — O09899 Supervision of other high risk pregnancies, unspecified trimester: Secondary | ICD-10-CM

## 2024-05-10 DIAGNOSIS — Z8632 Personal history of gestational diabetes: Secondary | ICD-10-CM

## 2024-05-10 DIAGNOSIS — O99019 Anemia complicating pregnancy, unspecified trimester: Secondary | ICD-10-CM

## 2024-05-10 DIAGNOSIS — O2301 Infections of kidney in pregnancy, first trimester: Secondary | ICD-10-CM

## 2024-05-10 DIAGNOSIS — G40909 Epilepsy, unspecified, not intractable, without status epilepticus: Secondary | ICD-10-CM

## 2024-05-10 NOTE — Progress Notes (Signed)
   PRENATAL VISIT NOTE  Subjective:  Taylor Colon is a 28 y.o. H5E7987 at [redacted]w[redacted]d being seen today for ongoing prenatal care.  She is currently monitored for the following issues for this high-risk pregnancy and has Pyelonephritis complicating pregnancy, first trimester; [redacted] weeks gestation of pregnancy; Vaginitis complicating current pregnancy, first trimester; Seizure disorder (HCC); Hypokalemia; Supervision of high risk pregnancy, antepartum; Short interval between pregnancies affecting pregnancy, antepartum; History of diet controlled gestational diabetes mellitus (GDM); Iron  deficiency anemia during pregnancy; and BMI 40.0-44.9, adult (HCC) on their problem list.  Patient reports no complaints.  Contractions: Not present. Vag. Bleeding: None.  Movement: Present. Denies leaking of fluid.   The following portions of the patient's history were reviewed and updated as appropriate: allergies, current medications, past family history, past medical history, past social history, past surgical history and problem list.   Objective:    Vitals:   05/10/24 1358  BP: 117/70  Pulse: (!) 124  Weight: 248 lb 0.6 oz (112.5 kg)    Fetal Status:      Movement: Present    General: Alert, oriented and cooperative. Patient is in no acute distress.  Skin: Skin is warm and dry. No rash noted.   Cardiovascular: Normal heart rate noted  Respiratory: Normal respiratory effort, no problems with respiration noted  Abdomen: Soft, gravid, appropriate for gestational age.  Pain/Pressure: Absent     Pelvic: Cervical exam deferred        Extremities: Normal range of motion.  Edema: None  Mental Status: Normal mood and affect. Normal behavior. Normal judgment and thought content.   Assessment and Plan:  Pregnancy: H5E7987 at [redacted]w[redacted]d 1. Supervision of high risk pregnancy, antepartum (Primary) FHT normal  2. Pyelonephritis complicating pregnancy, first trimester Taking macrobid   3. Seizure disorder (HCC) On  keppra   4. Short interval between pregnancies affecting pregnancy, antepartum  5. History of diet controlled gestational diabetes mellitus (GDM)  6. Iron  deficiency anemia during pregnancy Needs iron  infusion  7. BMI 40.0-44.9, adult (HCC)   Preterm labor symptoms and general obstetric precautions including but not limited to vaginal bleeding, contractions, leaking of fluid and fetal movement were reviewed in detail with the patient. Please refer to After Visit Summary for other counseling recommendations.   No follow-ups on file.  Future Appointments  Date Time Provider Department Center  05/29/2024  3:30 PM [facility] PROVIDER 1 [facility]  05/29/2024  3:45 PM [facility] US5 [facility]  06/06/2024  3:50 PM Abigail, Rollo DASEN, MD [facility] None  06/28/2024  3:30 PM [facility] PROVIDER 1 [facility]  06/28/2024  3:45 PM [facility] US5 [facility]  07/04/2024  3:50 PM Dunn, Rollo DASEN, MD [facility] None    Tahra Hitzeman J Kassadee Carawan, DO

## 2024-05-29 ENCOUNTER — Ambulatory Visit

## 2024-06-06 ENCOUNTER — Ambulatory Visit (INDEPENDENT_AMBULATORY_CARE_PROVIDER_SITE_OTHER): Admitting: Obstetrics and Gynecology

## 2024-06-06 VITALS — BP 111/66 | HR 101 | Wt 259.0 lb

## 2024-06-06 DIAGNOSIS — F32A Depression, unspecified: Secondary | ICD-10-CM

## 2024-06-06 DIAGNOSIS — G40909 Epilepsy, unspecified, not intractable, without status epilepticus: Secondary | ICD-10-CM

## 2024-06-06 DIAGNOSIS — O0990 Supervision of high risk pregnancy, unspecified, unspecified trimester: Secondary | ICD-10-CM | POA: Diagnosis not present

## 2024-06-06 DIAGNOSIS — Z3A26 26 weeks gestation of pregnancy: Secondary | ICD-10-CM

## 2024-06-06 DIAGNOSIS — O2300 Infections of kidney in pregnancy, unspecified trimester: Secondary | ICD-10-CM

## 2024-06-06 DIAGNOSIS — O99019 Anemia complicating pregnancy, unspecified trimester: Secondary | ICD-10-CM | POA: Diagnosis not present

## 2024-06-06 DIAGNOSIS — D509 Iron deficiency anemia, unspecified: Secondary | ICD-10-CM

## 2024-06-06 MED ORDER — SERTRALINE HCL 25 MG PO TABS: 25.0000 mg | ORAL_TABLET | Freq: Every day | ORAL | 3 refills | Status: DC

## 2024-06-06 NOTE — Progress Notes (Signed)
   PRENATAL VISIT NOTE  Subjective:  Taylor Colon is a 28 y.o. H5E7987 at [redacted]w[redacted]d being seen today for ongoing prenatal care.  She is currently monitored for the following issues for this high-risk pregnancy and has Pyelonephritis complicating pregnancy, first trimester; [redacted] weeks gestation of pregnancy; Vaginitis complicating current pregnancy, first trimester; Seizure disorder (HCC); Hypokalemia; Supervision of high risk pregnancy, antepartum; Short interval between pregnancies affecting pregnancy, antepartum; History of diet controlled gestational diabetes mellitus (GDM); Iron  deficiency anemia during pregnancy; and BMI 40.0-44.9, adult (HCC) on their problem list.  Patient reports feeling down, not wanting to be around people. Tearful during discussion. Denies SI/HI. Denies hx depression/anxiety and has never felt this way before. No new life stressors. 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  Contractions: Not present. Vag. Bleeding: None.  Movement: Present. Denies leaking of fluid.   The following portions of the patient's history were reviewed and updated as appropriate: allergies, current medications, past family history, past medical history, past social history, past surgical history and problem list.   Objective:   Vitals:   06/06/24 1545  BP: 111/66  Pulse: (!) 101  Weight: 259 lb (117.5 kg)   Body mass index is 47.37 kg/m. Total weight gain: 21 lb (9.526 kg)   Fetal Status: Fetal Heart Rate (bpm): 143 Fundal Height: 26 cm Movement: Present     General:  Alert, oriented and cooperative. Patient is in no acute distress.  Skin: Skin is warm and dry. No rash noted.   Cardiovascular: Normal heart rate noted  Respiratory: Normal respiratory effort, no problems with respiration noted  Abdomen: Soft, gravid, appropriate for gestational age.  Pain/Pressure: Absent     Pelvic: Cervical exam deferred        Extremities: Normal range of motion.  Edema: Trace  Mental  Status: Normal mood and affect. Normal behavior. Normal judgment and thought content.   Assessment and Plan:  Pregnancy: H5E7987 at [redacted]w[redacted]d 1. Supervision of high risk pregnancy, antepartum (Primary) Anticipatory guidance Third trimester labs next visit - Ambulatory referral to [facility]  2. Pyelonephritis affecting pregnancy, antepartum Compliant with macrobid  Check urine today - Culture, OB Urine  3. Iron  deficiency anemia during pregnancy Getting iron  infusions  4. Seizure disorder (HCC) Continue keppra   5. [redacted] weeks gestation of pregnancy - Culture, OB Urine - Ambulatory referral to [facility]  6. Depression, unspecified depression type Support given. Recommend referral to [facility] and start SSRI. Patient interested. Rx given, precautions reviewed - Ambulatory referral to [facility] - sertraline  (ZOLOFT ) 25 MG tablet; Take 1 tablet (25 mg total) by mouth daily.  Dispense: 30 tablet; Refill: 3   Preterm labor symptoms and general obstetric precautions including but not limited to vaginal bleeding, contractions, leaking of fluid and fetal movement were reviewed in detail with the patient. Please refer to After Visit Summary for other counseling recommendations.   Return in about 2 weeks (around 06/20/2024).  Future Appointments  Date Time Provider Department Center  06/08/2024 10:00 AM [facility] 2 [facility] None  06/28/2024  3:30 PM [facility] PROVIDER 1 [facility]  06/28/2024  3:45 PM [facility] US5 [facility]  07/04/2024  3:50 PM Sharol Croghan, Rollo DASEN, MD [facility] None    Rollo DASEN Bring, MD

## 2024-06-08 ENCOUNTER — Ambulatory Visit (INDEPENDENT_AMBULATORY_CARE_PROVIDER_SITE_OTHER)

## 2024-06-08 VITALS — BP 113/77 | HR 95 | Temp 98.3°F | Resp 16 | Ht 63.0 in | Wt 258.6 lb

## 2024-06-08 DIAGNOSIS — Z3A26 26 weeks gestation of pregnancy: Secondary | ICD-10-CM | POA: Diagnosis not present

## 2024-06-08 DIAGNOSIS — Z3A10 10 weeks gestation of pregnancy: Secondary | ICD-10-CM

## 2024-06-08 DIAGNOSIS — D509 Iron deficiency anemia, unspecified: Secondary | ICD-10-CM

## 2024-06-08 DIAGNOSIS — O99012 Anemia complicating pregnancy, second trimester: Secondary | ICD-10-CM

## 2024-06-08 LAB — CULTURE, OB URINE

## 2024-06-08 LAB — URINE CULTURE, OB REFLEX

## 2024-06-08 MED ORDER — SODIUM CHLORIDE 0.9 % IV SOLN
300.0000 mg | Freq: Once | INTRAVENOUS | Status: AC
  Administered 2024-06-08: 300 mg via INTRAVENOUS
  Filled 2024-06-08: qty 15

## 2024-06-08 NOTE — Patient Instructions (Signed)

## 2024-06-08 NOTE — Progress Notes (Signed)
 Diagnosis: Iron  Deficiency Anemia  Provider:  Mannam, Praveen MD  Procedure: IV Infusion  IV Type: Peripheral, IV Location: R Antecubital  Venofer  (Iron  Sucrose), Dose: 300 mg  Infusion Start Time: 1023  Infusion Stop Time: 1206  Post Infusion IV Care: Patient requested 15 min observation. Observation complete and PIV discontinued.   Discharge: Condition: Good, Destination: Home . AVS Provided  Performed by:  Rocky FORBES Sar, RN

## 2024-06-21 ENCOUNTER — Ambulatory Visit (INDEPENDENT_AMBULATORY_CARE_PROVIDER_SITE_OTHER): Admitting: *Deleted

## 2024-06-21 VITALS — BP 111/72 | HR 104 | Temp 98.8°F | Resp 16 | Ht 63.0 in | Wt 263.6 lb

## 2024-06-21 DIAGNOSIS — O99013 Anemia complicating pregnancy, third trimester: Secondary | ICD-10-CM | POA: Diagnosis not present

## 2024-06-21 DIAGNOSIS — D509 Iron deficiency anemia, unspecified: Secondary | ICD-10-CM

## 2024-06-21 DIAGNOSIS — Z3A28 28 weeks gestation of pregnancy: Secondary | ICD-10-CM

## 2024-06-21 DIAGNOSIS — Z3A10 10 weeks gestation of pregnancy: Secondary | ICD-10-CM

## 2024-06-21 MED ORDER — SODIUM CHLORIDE 0.9 % IV SOLN
300.0000 mg | Freq: Once | INTRAVENOUS | Status: AC
  Administered 2024-06-21: 300 mg via INTRAVENOUS
  Filled 2024-06-21: qty 15

## 2024-06-21 NOTE — Progress Notes (Signed)
 Diagnosis: Iron  Deficiency Anemia  Provider:  Mannam, Praveen MD  Procedure: IV Infusion  IV Type: Peripheral, IV Location: R Forearm  Venofer  (Iron  Sucrose), Dose: 300 mg  Infusion Start Time: 1141 am  Infusion Stop Time: 1320 pm  Post Infusion IV Care: Observation period completed and Peripheral IV Discontinued  Discharge: Condition: Good, Destination: Home . AVS Provided  Performed by:  Trudy Lamarr LABOR, RN

## 2024-06-25 ENCOUNTER — Other Ambulatory Visit (HOSPITAL_BASED_OUTPATIENT_CLINIC_OR_DEPARTMENT_OTHER): Payer: Self-pay

## 2024-06-25 ENCOUNTER — Encounter: Payer: Self-pay | Admitting: Obstetrics & Gynecology

## 2024-06-25 ENCOUNTER — Encounter: Payer: Self-pay | Admitting: Obstetrics and Gynecology

## 2024-06-25 ENCOUNTER — Ambulatory Visit (INDEPENDENT_AMBULATORY_CARE_PROVIDER_SITE_OTHER): Admitting: Obstetrics & Gynecology

## 2024-06-25 VITALS — BP 111/62 | HR 107 | Wt 264.0 lb

## 2024-06-25 DIAGNOSIS — O0990 Supervision of high risk pregnancy, unspecified, unspecified trimester: Secondary | ICD-10-CM | POA: Diagnosis not present

## 2024-06-25 DIAGNOSIS — Z98891 History of uterine scar from previous surgery: Secondary | ICD-10-CM | POA: Insufficient documentation

## 2024-06-25 DIAGNOSIS — Z8632 Personal history of gestational diabetes: Secondary | ICD-10-CM | POA: Diagnosis not present

## 2024-06-25 DIAGNOSIS — Z3A29 29 weeks gestation of pregnancy: Secondary | ICD-10-CM | POA: Diagnosis not present

## 2024-06-25 DIAGNOSIS — G40909 Epilepsy, unspecified, not intractable, without status epilepticus: Secondary | ICD-10-CM

## 2024-06-25 MED ORDER — FLUCONAZOLE 150 MG PO TABS
150.0000 mg | ORAL_TABLET | Freq: Once | ORAL | 0 refills | Status: AC
  Filled 2024-06-25: qty 1, 1d supply, fill #0

## 2024-06-25 NOTE — Progress Notes (Signed)
   PRENATAL VISIT NOTE  Subjective:  Taylor Colon is a 28 y.o. H5E7987 at [redacted]w[redacted]d being seen today for ongoing prenatal care.  She is currently monitored for the following issues for this high-risk pregnancy and has Pyelonephritis complicating pregnancy, first trimester; [redacted] weeks gestation of pregnancy; Vaginitis complicating current pregnancy, first trimester; Seizure disorder (HCC); Hypokalemia; Supervision of high risk pregnancy, antepartum; Short interval between pregnancies affecting pregnancy, antepartum; History of diet controlled gestational diabetes mellitus (GDM); Iron  deficiency anemia during pregnancy; BMI 40.0-44.9, adult (HCC); and History of 2 cesarean sections on their problem list.  Patient reports no leaking and vaginal irritation.  Contractions: Not present. Vag. Bleeding: None.  Movement: Present. Denies leaking of fluid.   The following portions of the patient's history were reviewed and updated as appropriate: allergies, current medications, past family history, past medical history, past social history, past surgical history and problem list.   Objective:    Vitals:   06/25/24 0827  BP: 111/62  Pulse: (!) 107  Weight: 264 lb (119.7 kg)    Fetal Status:  Fetal Heart Rate (bpm): 138   Movement: Present    General: Alert, oriented and cooperative. Patient is in no acute distress.  Skin: Skin is warm and dry. No rash noted.   Cardiovascular: Normal heart rate noted  Respiratory: Normal respiratory effort, no problems with respiration noted  Abdomen: Soft, gravid, appropriate for gestational age.  Pain/Pressure: Present     Pelvic: Cervical exam deferred        Extremities: Normal range of motion.  Edema: Trace  Mental Status: Normal mood and affect. Normal behavior. Normal judgment and thought content.   Assessment and Plan:  Pregnancy: G4P2012 at [redacted]w[redacted]d 1. [redacted] weeks gestation of pregnancy (Primary)  - Glucose Tolerance, 2 Hours w/1 Hour - RPR - HIV Antibody  (routine testing w rflx) - CBC - Amb ref to Integrated Behavioral Health  2. Supervision of high risk pregnancy, antepartum Sx of yeast vaginitis - fluconazole  (DIFLUCAN ) 150 MG tablet; Take 1 tablet (150 mg total) by mouth once for 1 dose.  Dispense: 1 tablet; Refill: 0  3. History of diet controlled gestational diabetes mellitus (GDM) GTT today  4. History of 2 cesarean sections Repeat at 39 weeks  5. Seizure disorder (HCC) On Keppra   Preterm labor symptoms and general obstetric precautions including but not limited to vaginal bleeding, contractions, leaking of fluid and fetal movement were reviewed in detail with the patient. Please refer to After Visit Summary for other counseling recommendations.   Return in about 3 weeks (around 07/16/2024).  Future Appointments  Date Time Provider Department Center  06/28/2024 11:00 AM [facility]-CHAIR 1 [facility] None  06/28/2024  3:30 PM [facility] PROVIDER 1 [facility]  06/28/2024  3:45 PM [facility] US5 [facility]  07/09/2024  3:50 PM Dunn, Rollo DASEN, MD [facility] None  07/23/2024  3:10 PM Abigail Rollo DASEN, MD [facility] None  08/09/2024  3:30 PM Barbra Lang PARAS, DO [facility] None  08/20/2024  3:50 PM Claudene, [place] , CNM [facility] None  08/29/2024  1:10 PM Dunn, Rollo DASEN, MD [facility] None    Lynwood Solomons, MD

## 2024-06-26 LAB — CBC
Hematocrit: 37.1 % (ref 34.0–46.6)
Hemoglobin: 11.6 g/dL (ref 11.1–15.9)
MCH: 28.1 pg (ref 26.6–33.0)
MCHC: 31.3 g/dL — ABNORMAL LOW (ref 31.5–35.7)
MCV: 90 fL (ref 79–97)
Platelets: 197 x10E3/uL (ref 150–450)
RBC: 4.13 x10E6/uL (ref 3.77–5.28)
RDW: 14.9 % (ref 11.7–15.4)
WBC: 4.7 x10E3/uL (ref 3.4–10.8)

## 2024-06-26 LAB — RPR: RPR Ser Ql: NONREACTIVE

## 2024-06-26 LAB — GLUCOSE TOLERANCE, 2 HOURS W/ 1HR
Glucose, 1 hour: 194 mg/dL — ABNORMAL HIGH (ref 70–179)
Glucose, 2 hour: 71 mg/dL (ref 70–152)
Glucose, Fasting: 78 mg/dL (ref 70–91)

## 2024-06-26 LAB — HIV ANTIBODY (ROUTINE TESTING W REFLEX): HIV Screen 4th Generation wRfx: NONREACTIVE

## 2024-06-28 ENCOUNTER — Other Ambulatory Visit: Payer: Self-pay | Admitting: *Deleted

## 2024-06-28 ENCOUNTER — Ambulatory Visit: Attending: Obstetrics and Gynecology

## 2024-06-28 ENCOUNTER — Ambulatory Visit (INDEPENDENT_AMBULATORY_CARE_PROVIDER_SITE_OTHER): Admitting: *Deleted

## 2024-06-28 ENCOUNTER — Ambulatory Visit (HOSPITAL_BASED_OUTPATIENT_CLINIC_OR_DEPARTMENT_OTHER): Admitting: Obstetrics and Gynecology

## 2024-06-28 VITALS — BP 121/79 | HR 100 | Temp 99.0°F | Resp 20 | Ht 63.0 in | Wt 268.4 lb

## 2024-06-28 VITALS — BP 126/65 | HR 104

## 2024-06-28 DIAGNOSIS — O34219 Maternal care for unspecified type scar from previous cesarean delivery: Secondary | ICD-10-CM

## 2024-06-28 DIAGNOSIS — O09293 Supervision of pregnancy with other poor reproductive or obstetric history, third trimester: Secondary | ICD-10-CM

## 2024-06-28 DIAGNOSIS — O99213 Obesity complicating pregnancy, third trimester: Secondary | ICD-10-CM | POA: Diagnosis present

## 2024-06-28 DIAGNOSIS — O24410 Gestational diabetes mellitus in pregnancy, diet controlled: Secondary | ICD-10-CM | POA: Insufficient documentation

## 2024-06-28 DIAGNOSIS — O26879 Cervical shortening, unspecified trimester: Secondary | ICD-10-CM

## 2024-06-28 DIAGNOSIS — D509 Iron deficiency anemia, unspecified: Secondary | ICD-10-CM

## 2024-06-28 DIAGNOSIS — G40909 Epilepsy, unspecified, not intractable, without status epilepticus: Secondary | ICD-10-CM | POA: Insufficient documentation

## 2024-06-28 DIAGNOSIS — Z6841 Body Mass Index (BMI) 40.0 and over, adult: Secondary | ICD-10-CM | POA: Insufficient documentation

## 2024-06-28 DIAGNOSIS — O99013 Anemia complicating pregnancy, third trimester: Secondary | ICD-10-CM

## 2024-06-28 DIAGNOSIS — O99353 Diseases of the nervous system complicating pregnancy, third trimester: Secondary | ICD-10-CM

## 2024-06-28 DIAGNOSIS — Z3A29 29 weeks gestation of pregnancy: Secondary | ICD-10-CM | POA: Insufficient documentation

## 2024-06-28 DIAGNOSIS — Z3A10 10 weeks gestation of pregnancy: Secondary | ICD-10-CM

## 2024-06-28 DIAGNOSIS — Z362 Encounter for other antenatal screening follow-up: Secondary | ICD-10-CM | POA: Insufficient documentation

## 2024-06-28 DIAGNOSIS — E6689 Other obesity not elsewhere classified: Secondary | ICD-10-CM | POA: Insufficient documentation

## 2024-06-28 DIAGNOSIS — O24415 Gestational diabetes mellitus in pregnancy, controlled by oral hypoglycemic drugs: Secondary | ICD-10-CM

## 2024-06-28 MED ORDER — SODIUM CHLORIDE 0.9 % IV SOLN
300.0000 mg | Freq: Once | INTRAVENOUS | Status: AC
  Administered 2024-06-28: 300 mg via INTRAVENOUS
  Filled 2024-06-28: qty 15

## 2024-06-28 NOTE — Progress Notes (Signed)
 Diagnosis: Iron  Deficiency Anemia  Provider:  Mannam, Praveen MD  Procedure: IV Infusion  IV Type: Peripheral, IV Location: L Forearm  Venofer  (Iron  Sucrose), Dose: 300 mg  Infusion Start Time: 1127 am  Infusion Stop Time: 1315 pm  Post Infusion IV Care: Observation period completed and Peripheral IV Discontinued  Discharge: Condition: Good, Destination: Home . AVS Provided  Performed by:  Trudy Lamarr LABOR, RN

## 2024-06-28 NOTE — Progress Notes (Signed)
 Maternal-Fetal Medicine Consultation  Name: Taylor Colon  MRN: [id number]  GA: H5E7987 [redacted]w[redacted]d   Patient is here for fetal growth assessment. She has a new diagnosis of gestational diabetes and was not aware of the diagnosis. Seizure disorder.  Patient takes Keppra  and has not had seizures in this pregnancy. Ultrasound Fetal growth is appropriate for gestational age.  Amniotic fluid normal good fetal activity seen.  Fetal anatomical survey was completed and appears normal. I counseled the patient on the diagnosis of gestational diabetes.  Gestational diabetes I explained the diagnosis of gestational diabetes.  I emphasized the importance of good blood glucose control to prevent adverse fetal or neonatal outcomes.  I discussed normal range of blood glucose values. I encouraged her to check her blood glucose regularly. Possible complications of gestational diabetes include fetal macrosomia, shoulder dystocia and birth injuries, stillbirth (in poorly controlled diabetes) and neonatal respiratory syndrome and other complications.  In about 85% of cases, gestational diabetes is well controlled by diet alone.  Exercise reduces the need for insulin.  Medical treatment includes oral hypoglycemics or insulin.  Timing of delivery: In well-controlled diabetes on diet, patient can be delivered at 51- or 40-weeks' gestation. Vaginal delivery can be safely attempted. Type 2 diabetes develops in up to 50% of women with GDM. I recommend postpartum screening with 75-g glucose load at 6 to 12 weeks after delivery. Patient is very upset with the diagnosis and I encouraged her to meet with our diabetic educator.  Recommendations - An appointment was made for her to return in 5 weeks for fetal growth assessment and BPP. - Weekly antenatal testing from [redacted] weeks gestation until delivery (patient reported that she may be unable to make those weekly visits because of her work commitments.     Consultation  including face-to-face (more than 50%) counseling 20 minutes.

## 2024-07-03 ENCOUNTER — Encounter: Payer: Self-pay | Admitting: Obstetrics and Gynecology

## 2024-07-03 ENCOUNTER — Encounter: Admitting: Licensed Clinical Social Worker

## 2024-07-03 ENCOUNTER — Other Ambulatory Visit (HOSPITAL_BASED_OUTPATIENT_CLINIC_OR_DEPARTMENT_OTHER): Payer: Self-pay

## 2024-07-03 ENCOUNTER — Other Ambulatory Visit: Payer: Self-pay

## 2024-07-03 DIAGNOSIS — O24419 Gestational diabetes mellitus in pregnancy, unspecified control: Secondary | ICD-10-CM | POA: Insufficient documentation

## 2024-07-03 MED ORDER — ACCU-CHEK SOFTCLIX LANCETS MISC
12 refills | Status: DC
  Filled 2024-07-03: qty 100, 30d supply, fill #0

## 2024-07-03 MED ORDER — ACCU-CHEK SOFTCLIX LANCETS MISC
1.0000 | Freq: Four times a day (QID) | 12 refills | Status: DC
  Filled 2024-07-03: qty 100, 25d supply, fill #0

## 2024-07-03 MED ORDER — ACCU-CHEK GUIDE W/DEVICE KIT
1.0000 | PACK | Freq: Four times a day (QID) | 0 refills | Status: DC
  Filled 2024-07-03: qty 1, 30d supply, fill #0

## 2024-07-03 NOTE — BH Specialist Note (Unsigned)
 Integrated Behavioral Health via Telemedicine Visit  07/03/2024 Taylor Colon [id number]  Number of Integrated Behavioral Health Clinician visits: No data recorded Session Start time: No data recorded  Session End time: No data recorded Total time in minutes: No data recorded   Referring Provider: *** Patient/Family location: *** [facility] Provider location: *** All persons participating in visit: *** Types of Service: {CHL AMB TYPE OF SERVICE:[phone]}  I connected with Taylor Colon and/or Taylor Colon's {family members:20773} via  Telephone or Engineer, civil (consulting)  (Video is Surveyor, mining) and verified that I am speaking with the correct person using two identifiers. Discussed confidentiality: {YES/NO:21197}  I discussed the limitations of telemedicine and the availability of in person appointments.  Discussed there is a possibility of technology failure and discussed alternative modes of communication if that failure occurs.  I discussed that engaging in this telemedicine visit, they consent to the provision of behavioral healthcare and the services will be billed under their insurance.  Patient and/or legal guardian expressed understanding and consented to Telemedicine visit: {YES/NO:21197}  Presenting Concerns: Patient and/or family reports the following symptoms/concerns: *** Duration of problem: ***; Severity of problem: {Mild/Moderate/Severe:20260}  Patient and/or Family's Strengths/Protective Factors: {CHL AMB BH PROTECTIVE FACTORS:[phone]}  Goals Addressed: Patient will:  Reduce symptoms of: {IBH Symptoms:21014056}   Increase knowledge and/or ability of: {IBH Patient Tools:21014057}   Demonstrate ability to: {IBH Goals:21014053}  Progress towards Goals: {CHL AMB BH PROGRESS TOWARDS GOALS:[phone]}    Interventions: Interventions utilized:  {IBH Interventions:21014054} Standardized Assessments completed: {IBH Screening  Tools:21014051}    Patient and/or Family Response: ***  Clinical Assessment/Diagnosis  No diagnosis found.    Assessment: Patient currently experiencing ***.   Patient may benefit from ***.  Plan: Follow up with behavioral health clinician on : *** Behavioral recommendations: *** Referral(s): {IBH Referrals:21014055}  I discussed the assessment and treatment plan with the patient and/or parent/guardian. They were provided an opportunity to ask questions and all were answered. They agreed with the plan and demonstrated an understanding of the instructions.   They were advised to call back or seek an in-person evaluation if the symptoms worsen or if the condition fails to improve as anticipated.  Allisha Harter LITTIE Seats, LCSWA

## 2024-07-04 ENCOUNTER — Encounter: Admitting: Obstetrics and Gynecology

## 2024-07-09 ENCOUNTER — Encounter: Admitting: Obstetrics and Gynecology

## 2024-07-10 ENCOUNTER — Encounter: Admitting: Licensed Clinical Social Worker

## 2024-07-13 ENCOUNTER — Telehealth: Payer: Self-pay

## 2024-07-13 ENCOUNTER — Other Ambulatory Visit (HOSPITAL_BASED_OUTPATIENT_CLINIC_OR_DEPARTMENT_OTHER): Payer: Self-pay

## 2024-07-23 ENCOUNTER — Ambulatory Visit: Admitting: Obstetrics and Gynecology

## 2024-07-23 ENCOUNTER — Other Ambulatory Visit (HOSPITAL_BASED_OUTPATIENT_CLINIC_OR_DEPARTMENT_OTHER): Payer: Self-pay

## 2024-07-23 VITALS — BP 106/61 | HR 103 | Wt 269.0 lb

## 2024-07-23 DIAGNOSIS — O24419 Gestational diabetes mellitus in pregnancy, unspecified control: Secondary | ICD-10-CM

## 2024-07-23 DIAGNOSIS — Z0289 Encounter for other administrative examinations: Secondary | ICD-10-CM

## 2024-07-23 DIAGNOSIS — O99019 Anemia complicating pregnancy, unspecified trimester: Secondary | ICD-10-CM | POA: Diagnosis not present

## 2024-07-23 DIAGNOSIS — G40909 Epilepsy, unspecified, not intractable, without status epilepticus: Secondary | ICD-10-CM

## 2024-07-23 DIAGNOSIS — O2300 Infections of kidney in pregnancy, unspecified trimester: Secondary | ICD-10-CM

## 2024-07-23 DIAGNOSIS — O0990 Supervision of high risk pregnancy, unspecified, unspecified trimester: Secondary | ICD-10-CM

## 2024-07-23 DIAGNOSIS — F32A Depression, unspecified: Secondary | ICD-10-CM

## 2024-07-23 DIAGNOSIS — O99340 Other mental disorders complicating pregnancy, unspecified trimester: Secondary | ICD-10-CM

## 2024-07-23 DIAGNOSIS — O99350 Diseases of the nervous system complicating pregnancy, unspecified trimester: Secondary | ICD-10-CM

## 2024-07-23 DIAGNOSIS — D509 Iron deficiency anemia, unspecified: Secondary | ICD-10-CM

## 2024-07-23 DIAGNOSIS — Z3A33 33 weeks gestation of pregnancy: Secondary | ICD-10-CM

## 2024-07-23 DIAGNOSIS — O34219 Maternal care for unspecified type scar from previous cesarean delivery: Secondary | ICD-10-CM

## 2024-07-23 MED ORDER — SERTRALINE HCL 25 MG PO TABS
50.0000 mg | ORAL_TABLET | Freq: Every day | ORAL | 3 refills | Status: AC
  Filled 2024-07-23: qty 60, 30d supply, fill #0

## 2024-07-23 NOTE — Progress Notes (Signed)
 PRENATAL VISIT NOTE  Subjective:  Taylor Colon is a 28 y.o. H5E7987 at [redacted]w[redacted]d being seen today for ongoing prenatal care.  She is currently monitored for the following issues for this high-risk pregnancy and has Pyelonephritis complicating pregnancy, first trimester; [redacted] weeks gestation of pregnancy; Vaginitis complicating current pregnancy, first trimester; Seizure disorder (HCC); Hypokalemia; Supervision of high risk pregnancy, antepartum; Short interval between pregnancies affecting pregnancy, antepartum; History of diet controlled gestational diabetes mellitus (GDM); Iron  deficiency anemia during pregnancy; BMI 40.0-44.9, adult (HCC); History of 2 cesarean sections; Gestational diabetes mellitus (GDM) affecting pregnancy; and Gestational diabetes mellitus (GDM) affecting pregnancy, antepartum on their problem list.  Patient reports no complaints. Checking FSGs, no log today but reports that her numbers seem to be in range. Reports mood symptoms improved with zoloft , but still feels like she could feel better  Contractions: Not present. Vag. Bleeding: None.  Movement: Present. Denies leaking of fluid.   The following portions of the patient's history were reviewed and updated as appropriate: allergies, current medications, past family history, past medical history, past social history, past surgical history and problem list.   Objective:   Vitals:   07/23/24 1519  BP: 106/61  Pulse: (!) 103  Weight: 269 lb (122 kg)   Body mass index is 47.65 kg/m. Total weight gain: 31 lb (14.1 kg)   Fetal Status: Fetal Heart Rate (bpm): 147 Fundal Height: 33 cm Movement: Present     General:  Alert, oriented and cooperative. Patient is in no acute distress.  Skin: Skin is warm and dry. No rash noted.   Cardiovascular: Normal heart rate noted  Respiratory: Normal respiratory effort, no problems with respiration noted  Abdomen: Soft, gravid, appropriate for gestational age.  Pain/Pressure: Present      Pelvic: Cervical exam deferred        Extremities: Normal range of motion.  Edema: Trace  Mental Status: Normal mood and affect. Normal behavior. Normal judgment and thought content.   Assessment and Plan:  Pregnancy: H5E7987 at [redacted]w[redacted]d 1. Supervision of high risk pregnancy, antepartum (Primary) Anticipatory guidance  2. Gestational diabetes mellitus (GDM) affecting pregnancy, antepartum Serial growth and antenatal testing scheduled with MFM I have asked her to send me a picture of her BS log for my review to ensure she is in range Continue close monitoring  3. Pyelonephritis affecting pregnancy, antepartum Compliant with antibiotics   4. Iron  deficiency anemia during pregnancy S/p IV iron   5. Seizure disorder during pregnancy, antepartum (HCC) Keppra , no new seizures  6. Depression affecting pregnancy Increase zoloft  rx  7. History of cesarean delivery, currently pregnant Plans repeat  8. [redacted] weeks gestation of pregnancy   9. Depression, unspecified depression type - sertraline  (ZOLOFT ) 25 MG tablet; Take 2 tablets (50 mg total) by mouth daily.  Dispense: 60 tablet; Refill: 3   Preterm labor symptoms and general obstetric precautions including but not limited to vaginal bleeding, contractions, leaking of fluid and fetal movement were reviewed in detail with the patient. Please refer to After Visit Summary for other counseling recommendations.   Return in about 2 weeks (around 08/06/2024).  Future Appointments  Date Time Provider Department Center  08/07/2024  2:45 PM [facility] PROVIDER 1 [facility]  08/07/2024  3:00 PM [facility] US3 [facility]  08/09/2024  3:30 PM Barbra Lang PARAS, DO [facility] None  08/20/2024  3:50 PM Claudene, [place] , CNM [facility] None  08/29/2024  1:10 PM Brandn Mcgath, Rollo DASEN, MD [facility] None    Rollo DASEN Bring, MD

## 2024-07-24 ENCOUNTER — Other Ambulatory Visit (HOSPITAL_BASED_OUTPATIENT_CLINIC_OR_DEPARTMENT_OTHER): Payer: Self-pay

## 2024-08-03 ENCOUNTER — Other Ambulatory Visit (HOSPITAL_BASED_OUTPATIENT_CLINIC_OR_DEPARTMENT_OTHER): Payer: Self-pay

## 2024-08-07 ENCOUNTER — Ambulatory Visit: Attending: Obstetrics and Gynecology

## 2024-08-07 ENCOUNTER — Ambulatory Visit: Admitting: Obstetrics

## 2024-08-07 VITALS — BP 129/75

## 2024-08-07 DIAGNOSIS — E669 Obesity, unspecified: Secondary | ICD-10-CM | POA: Diagnosis not present

## 2024-08-07 DIAGNOSIS — O99353 Diseases of the nervous system complicating pregnancy, third trimester: Secondary | ICD-10-CM

## 2024-08-07 DIAGNOSIS — O26879 Cervical shortening, unspecified trimester: Secondary | ICD-10-CM | POA: Insufficient documentation

## 2024-08-07 DIAGNOSIS — O24415 Gestational diabetes mellitus in pregnancy, controlled by oral hypoglycemic drugs: Secondary | ICD-10-CM | POA: Insufficient documentation

## 2024-08-07 DIAGNOSIS — O24410 Gestational diabetes mellitus in pregnancy, diet controlled: Secondary | ICD-10-CM

## 2024-08-07 DIAGNOSIS — Z3A35 35 weeks gestation of pregnancy: Secondary | ICD-10-CM

## 2024-08-07 DIAGNOSIS — O99213 Obesity complicating pregnancy, third trimester: Secondary | ICD-10-CM | POA: Insufficient documentation

## 2024-08-07 DIAGNOSIS — G40909 Epilepsy, unspecified, not intractable, without status epilepticus: Secondary | ICD-10-CM | POA: Diagnosis present

## 2024-08-07 DIAGNOSIS — Z6841 Body Mass Index (BMI) 40.0 and over, adult: Secondary | ICD-10-CM | POA: Insufficient documentation

## 2024-08-07 NOTE — Progress Notes (Signed)
 MFM Consult Note  Taylor Colon is currently at 35 weeks and 1 day.  She has been followed due to diet-controlled gestational diabetes and maternal obesity with a BMI of 43.5.    She denies any problems since her last exam and reports that her fingerstick values have been within normal limits.    Her blood pressure today was 129/75.  Sonographic findings Single intrauterine pregnancy. Fetal cardiac activity: Observed. Presentation: Cephalic. Fetal biometry shows the estimated fetal weight of 5 pounds 5 ounces which measures at the 25th percentile. Amniotic fluid: Within normal limits.  AFI: 17.63 cm.  MVP: 5.96 cm. Placenta: Posterior. BPP: 8/8.   Due to maternal obesity and diet-controlled gestational diabetes, we will continue to follow her with weekly fetal testing until delivery.    Delivery is recommended at between 39 to 40 weeks.  The patient has stated that she would prefer to have her repeat cesarean delivery scheduled on September 10, 2024, as she would like the baby to share the same birthday as her.  She will return in 1 week for an NST.    The patient stated that all of her questions were answered today.  A total of 20 minutes was spent counseling and coordinating the care for this patient.  Greater than 50% of the time was spent in direct face-to-face contact.

## 2024-08-09 ENCOUNTER — Ambulatory Visit (INDEPENDENT_AMBULATORY_CARE_PROVIDER_SITE_OTHER): Admitting: Family Medicine

## 2024-08-09 VITALS — BP 123/66 | HR 103 | Wt 272.0 lb

## 2024-08-09 DIAGNOSIS — O0990 Supervision of high risk pregnancy, unspecified, unspecified trimester: Secondary | ICD-10-CM | POA: Diagnosis not present

## 2024-08-09 DIAGNOSIS — Z6841 Body Mass Index (BMI) 40.0 and over, adult: Secondary | ICD-10-CM

## 2024-08-09 DIAGNOSIS — Z98891 History of uterine scar from previous surgery: Secondary | ICD-10-CM

## 2024-08-09 DIAGNOSIS — O24419 Gestational diabetes mellitus in pregnancy, unspecified control: Secondary | ICD-10-CM

## 2024-08-09 DIAGNOSIS — Z3A35 35 weeks gestation of pregnancy: Secondary | ICD-10-CM

## 2024-08-09 NOTE — Progress Notes (Signed)
 PRENATAL VISIT NOTE  Subjective:  Taylor Colon is a 28 y.o. H5E7987 at [redacted]w[redacted]d being seen today for ongoing prenatal care.  She is currently monitored for the following issues for this high-risk pregnancy and has Pyelonephritis complicating pregnancy, first trimester; [redacted] weeks gestation of pregnancy; Vaginitis complicating current pregnancy, first trimester; Seizure disorder (HCC); Hypokalemia; Supervision of high risk pregnancy, antepartum; Short interval between pregnancies affecting pregnancy, antepartum; History of diet controlled gestational diabetes mellitus (GDM); Iron  deficiency anemia during pregnancy; BMI 40.0-44.9, adult (HCC); History of 2 cesarean sections; Gestational diabetes mellitus (GDM) affecting pregnancy; and Gestational diabetes mellitus (GDM) affecting pregnancy, antepartum on their problem list.  Patient reports no complaints.  Contractions: Not present. Vag. Bleeding: None.  Movement: Present. Denies leaking of fluid.   The following portions of the patient's history were reviewed and updated as appropriate: allergies, current medications, past family history, past medical history, past social history, past surgical history and problem list.   Objective:   Vitals:   08/09/24 1533  BP: 123/66  Pulse: (!) 103  Weight: 272 lb (123.4 kg)    Fetal Status:  Fetal Heart Rate (bpm): 159   Movement: Present    General: Alert, oriented and cooperative. Patient is in no acute distress.  Skin: Skin is warm and dry. No rash noted.   Cardiovascular: Normal heart rate noted  Respiratory: Normal respiratory effort, no problems with respiration noted  Abdomen: Soft, gravid, appropriate for gestational age.  Pain/Pressure: Present     Pelvic: Cervical exam deferred        Extremities: Normal range of motion.  Edema: Trace  Mental Status: Normal mood and affect. Normal behavior. Normal judgment and thought content.      04/11/2024    1:16 PM  Depression screen PHQ 2/9   Decreased Interest 1  Down, Depressed, Hopeless 1  PHQ - 2 Score 2  Altered sleeping 0  Tired, decreased energy 1  Change in appetite 0  Feeling bad or failure about yourself  0  Trouble concentrating 0  Moving slowly or fidgety/restless 0  Suicidal thoughts 0  PHQ-9 Score 3      Data saved with a previous flowsheet row definition        04/11/2024    1:16 PM  GAD 7 : Generalized Anxiety Score  Nervous, Anxious, on Edge 0  Control/stop worrying 0  Worry too much - different things 1  Trouble relaxing 1  Restless 0  Easily annoyed or irritable 1  Afraid - awful might happen 0  Total GAD 7 Score 3    Assessment and Plan:  Pregnancy: H5E7987 at [redacted]w[redacted]d 1. [redacted] weeks gestation of pregnancy (Primary) - US  Fetal BPP W/O Non Stress; Future  2. Supervision of high risk pregnancy, antepartum FHT normal - US  Fetal BPP W/O Non Stress; Future  3. BMI 40.0-44.9, adult (HCC) BPP weekly - US  Fetal BPP W/O Non Stress; Future  4. Gestational diabetes mellitus (GDM) affecting pregnancy Blood sugars controlled on diet.  BPP weekly - US  Fetal BPP W/O Non Stress; Future  5. History of 2 cesarean sections Schedule for RLTCS - patient desires to be scheduled on 12/8 at - Ambulatory Referral For Surgery Scheduling   Preterm labor symptoms and general obstetric precautions including but not limited to vaginal bleeding, contractions, leaking of fluid and fetal movement were reviewed in detail with the patient. Please refer to After Visit Summary for other counseling recommendations.   No follow-ups on file.  Future Appointments  Date  Time Provider Department Center  08/20/2024  3:50 PM Claudene, [place] , CNM [facility] None  08/29/2024  1:10 PM Dunn, Rollo DASEN, MD [facility] None    Leandre Wien J Kyrianna Barletta, DO

## 2024-08-10 ENCOUNTER — Telehealth: Payer: Self-pay | Admitting: Family Medicine

## 2024-08-10 NOTE — Telephone Encounter (Signed)
 We received an inbasket to get patient scheduled for weekly bpp's so I gave her a call to get her on the schedule. During our conversation, the patient expressed that she doesn't want to drive to [place] weekly. She can only come biweekly and said take it or leave it. I explained that per doctor's request they would like her to come in weekly so I will schedule her weekly and if she cannot come to kindly let us  know so that we can reschedule. The patient hung up the call so I tried calling her back but she sent me to voice mail. Patient has been scheduled and information can be viewed on MyChart.

## 2024-08-14 ENCOUNTER — Other Ambulatory Visit

## 2024-08-20 ENCOUNTER — Ambulatory Visit (INDEPENDENT_AMBULATORY_CARE_PROVIDER_SITE_OTHER): Admitting: Advanced Practice Midwife

## 2024-08-20 VITALS — BP 101/55 | HR 115 | Wt 268.1 lb

## 2024-08-20 DIAGNOSIS — Z98891 History of uterine scar from previous surgery: Secondary | ICD-10-CM | POA: Diagnosis not present

## 2024-08-20 DIAGNOSIS — Z3A37 37 weeks gestation of pregnancy: Secondary | ICD-10-CM | POA: Diagnosis not present

## 2024-08-20 DIAGNOSIS — O0990 Supervision of high risk pregnancy, unspecified, unspecified trimester: Secondary | ICD-10-CM

## 2024-08-20 DIAGNOSIS — O24419 Gestational diabetes mellitus in pregnancy, unspecified control: Secondary | ICD-10-CM

## 2024-08-20 DIAGNOSIS — G40B09 Juvenile myoclonic epilepsy, not intractable, without status epilepticus: Secondary | ICD-10-CM | POA: Diagnosis not present

## 2024-08-20 NOTE — Progress Notes (Unsigned)
   PRENATAL VISIT NOTE  Subjective:  Taylor Colon is a 28 y.o. H5E7987 at [redacted]w[redacted]d being seen today for ongoing prenatal care.  She is currently monitored for the following issues for this high-risk pregnancy and has Pyelonephritis complicating pregnancy, first trimester; [redacted] weeks gestation of pregnancy; Vaginitis complicating current pregnancy, first trimester; Seizure disorder (HCC); Hypokalemia; Supervision of high risk pregnancy, antepartum; Short interval between pregnancies affecting pregnancy, antepartum; History of diet controlled gestational diabetes mellitus (GDM); Iron  deficiency anemia during pregnancy; BMI 40.0-44.9, adult (HCC); History of 2 cesarean sections; Gestational diabetes mellitus (GDM) affecting pregnancy; and Gestational diabetes mellitus (GDM) affecting pregnancy, antepartum on their problem list.   Patient reports {sx:14538}.  Contractions: Not present. Vag. Bleeding: None.  Movement: Present. Denies leaking of fluid.   The following portions of the patient's history were reviewed and updated as appropriate: allergies, current medications, past family history, past medical history, past social history, past surgical history and problem list.   Objective:   Vitals:   08/20/24 1616  BP: (!) 101/55  Pulse: (!) 115  Weight: 268 lb 1.3 oz (121.6 kg)    Fetal Status: Fetal Heart Rate (bpm): 147   Movement: Present     General:  Alert, oriented and cooperative. Patient is in no acute distress.  Skin: Skin is warm and dry. No rash noted.   Cardiovascular: Normal heart rate noted  Respiratory: Normal respiratory effort, no problems with respiration noted  Abdomen: Soft, gravid, appropriate for gestational age.  Pain/Pressure: Present     Pelvic: {Blank single:19197::Cervical exam performed in the presence of a chaperone,Cervical exam deferred}        Extremities: Normal range of motion.  Edema: None  Mental Status: Normal mood and affect. Normal behavior. Normal  judgment and thought content.   Assessment and Plan:  Pregnancy: H5E7987 at [redacted]w[redacted]d 1. [redacted] weeks gestation of pregnancy (Primary)  2. Supervision of high risk pregnancy, antepartum  Term labor symptoms and general obstetric precautions including but not limited to vaginal bleeding, contractions, leaking of fluid and fetal movement were reviewed in detail with the patient. Please refer to After Visit Summary for other counseling recommendations.   No follow-ups on file.  Future Appointments  Date Time Provider Department Center  08/21/2024 10:55 AM [facility] US2 [facility]  08/28/2024 10:55 AM [facility] US2 [facility]  08/29/2024  1:10 PM Abigail Rollo DASEN, MD [facility] None  09/04/2024 10:55 AM [facility] US2 [facility]  09/04/2024  3:50 PM Synthia Raisin, CNM [facility] None    Gerrod Maule  [facility] for [facility]

## 2024-08-21 ENCOUNTER — Encounter: Payer: Self-pay | Admitting: Advanced Practice Midwife

## 2024-08-21 ENCOUNTER — Other Ambulatory Visit

## 2024-08-27 ENCOUNTER — Encounter (HOSPITAL_COMMUNITY): Payer: Self-pay

## 2024-08-27 NOTE — Patient Instructions (Signed)
 Taylor Colon  08/27/2024   Your procedure is scheduled on:  09/10/2024  Arrive at 0745 at Entrance C on [address] at [facility]  and [employer]. You are invited to use the FREE valet parking or use the Visitor's parking deck.  Pick up the phone at the desk and dial [phone].  Call this number if you have problems the morning of surgery: [phone]  Remember:   Do not eat food:(After Midnight) Desps de medianoche.  You may drink clear liquids until  __0545___.  Clear liquids means a liquid you can see thru.  It can have color such as Cola or Kool aid.  Tea is OK and coffee as long as no milk or creamer of any kind.  Take these medicines the morning of surgery with A SIP OF WATER:  Take Kepra, Macrobid  and zoloft  as prescribed   Do not wear jewelry, make-up or nail polish.  Do not wear lotions, powders, or perfumes. Do not wear deodorant.  Do not shave 48 hours prior to surgery.  Do not bring valuables to the hospital.  [facility] is not   responsible for any belongings or valuables brought to the hospital.  Contacts, dentures or bridgework may not be worn into surgery.  Leave suitcase in the car. After surgery it may be brought to your room.  For patients admitted to the hospital, checkout time is 11:00 AM the day of              discharge.      Please read over the following fact sheets that you were given:     Preparing for Surgery

## 2024-08-28 ENCOUNTER — Other Ambulatory Visit

## 2024-08-29 ENCOUNTER — Other Ambulatory Visit (HOSPITAL_COMMUNITY)
Admission: RE | Admit: 2024-08-29 | Discharge: 2024-08-29 | Disposition: A | Source: Ambulatory Visit | Attending: Obstetrics and Gynecology | Admitting: Obstetrics and Gynecology

## 2024-08-29 ENCOUNTER — Ambulatory Visit (INDEPENDENT_AMBULATORY_CARE_PROVIDER_SITE_OTHER): Admitting: Obstetrics and Gynecology

## 2024-08-29 VITALS — BP 119/69 | HR 103 | Wt 269.0 lb

## 2024-08-29 DIAGNOSIS — O0990 Supervision of high risk pregnancy, unspecified, unspecified trimester: Secondary | ICD-10-CM | POA: Insufficient documentation

## 2024-08-29 DIAGNOSIS — Z98891 History of uterine scar from previous surgery: Secondary | ICD-10-CM

## 2024-08-29 DIAGNOSIS — Z3A38 38 weeks gestation of pregnancy: Secondary | ICD-10-CM

## 2024-08-29 DIAGNOSIS — O99353 Diseases of the nervous system complicating pregnancy, third trimester: Secondary | ICD-10-CM | POA: Diagnosis not present

## 2024-08-29 DIAGNOSIS — O24419 Gestational diabetes mellitus in pregnancy, unspecified control: Secondary | ICD-10-CM | POA: Diagnosis not present

## 2024-08-29 DIAGNOSIS — G40909 Epilepsy, unspecified, not intractable, without status epilepticus: Secondary | ICD-10-CM

## 2024-08-29 NOTE — Progress Notes (Signed)
 PRENATAL VISIT NOTE  Subjective:  Taylor Colon is a 28 y.o. H5E7987 at [redacted]w[redacted]d being seen today for ongoing prenatal care.  She is currently monitored for the following issues for this high-risk pregnancy and has Pyelonephritis complicating pregnancy, first trimester; Hypokalemia; Supervision of high risk pregnancy, antepartum; Short interval between pregnancies affecting pregnancy, antepartum; History of diet controlled gestational diabetes mellitus (GDM); BMI 40.0-44.9, adult (HCC); History of 2 cesarean sections; Gestational diabetes mellitus (GDM) affecting pregnancy, antepartum; and JME (juvenile myoclonic epilepsy) (HCC) on their problem list.  Patient reports no complaints.  Contractions: Not present. Vag. Bleeding: None.  Movement: Present. Denies leaking of fluid.   The following portions of the patient's history were reviewed and updated as appropriate: allergies, current medications, past family history, past medical history, past social history, past surgical history and problem list.   Objective:   Vitals:   08/29/24 1317  BP: 119/69  Pulse: (!) 103  Weight: 269 lb (122 kg)    Fetal Status:  Fetal Heart Rate (bpm): 148   Movement: Present    General: Alert, oriented and cooperative. Patient is in no acute distress.  Skin: Skin is warm and dry. No rash noted.   Cardiovascular: Normal heart rate noted  Respiratory: Normal respiratory effort, no problems with respiration noted  Abdomen: Soft, gravid, appropriate for gestational age.  Pain/Pressure: Absent     Pelvic: Swabs collected with nurse chaperone        Extremities: Normal range of motion.  Edema: None  Mental Status: Normal mood and affect. Normal behavior. Normal judgment and thought content.      04/11/2024    1:16 PM  Depression screen PHQ 2/9  Decreased Interest 1  Down, Depressed, Hopeless 1  PHQ - 2 Score 2  Altered sleeping 0  Tired, decreased energy 1  Change in appetite 0  Feeling bad or failure about  yourself  0  Trouble concentrating 0  Moving slowly or fidgety/restless 0  Suicidal thoughts 0  PHQ-9 Score 3      Data saved with a previous flowsheet row definition        04/11/2024    1:16 PM  GAD 7 : Generalized Anxiety Score  Nervous, Anxious, on Edge 0  Control/stop worrying 0  Worry too much - different things 1  Trouble relaxing 1  Restless 0  Easily annoyed or irritable 1  Afraid - awful might happen 0  Total GAD 7 Score 3    Assessment and Plan:  Pregnancy: [id number] at [redacted]w[redacted]d 1. Supervision of high risk pregnancy, antepartum (Primary) Anticipatory guidance Labs noted to be missing from initial prenatals- ordered today and swabs done today Declines flu/tdap - Hepatitis C antibody - Rubella screen - Hepatitis B surface antigen - Strep Gp B NAA - GC/Chlamydia probe amp ()not at [facility]  2. Gestational diabetes mellitus (GDM) affecting pregnancy, antepartum Good diet control, reports all numbers in range with rare elevation  3. History of 2 cesarean sections Repeat planned  4. Epilepsy affecting pregnancy in third trimester (HCC) Good control  5. [redacted] weeks gestation of pregnancy    Term labor symptoms and general obstetric precautions including but not limited to vaginal bleeding, contractions, leaking of fluid and fetal movement were reviewed in detail with the patient. Please refer to After Visit Summary for other counseling recommendations.   No follow-ups on file.  Future Appointments  Date Time Provider Department Center  09/04/2024 10:55 AM [facility] US2 [facility]  09/04/2024  3:50 PM  Synthia Raisin, CNM [facility] None  09/07/2024  3:00 PM [facility] PAT 1 [facility] None  09/17/2024  1:00 PM [facility] NURSE [facility] None  10/24/2024  1:50 PM Nyashia Raney, Rollo DASEN, MD [facility] None    Rollo DASEN Bring, MD

## 2024-08-30 ENCOUNTER — Ambulatory Visit: Payer: Self-pay | Admitting: Obstetrics and Gynecology

## 2024-08-30 DIAGNOSIS — O0990 Supervision of high risk pregnancy, unspecified, unspecified trimester: Secondary | ICD-10-CM

## 2024-08-30 LAB — HEPATITIS B SURFACE ANTIGEN: Hepatitis B Surface Ag: NEGATIVE

## 2024-08-30 LAB — HEPATITIS C ANTIBODY: Hep C Virus Ab: NONREACTIVE

## 2024-08-30 LAB — RUBELLA SCREEN: Rubella Antibodies, IGG: 1.12 {index} (ref >0.99)

## 2024-08-31 LAB — GC/CHLAMYDIA PROBE AMP (~~LOC~~) NOT AT ARMC
Chlamydia: NEGATIVE
Comment: NEGATIVE
Comment: NORMAL
Neisseria Gonorrhea: NEGATIVE

## 2024-08-31 LAB — STREP GP B NAA: Strep Gp B NAA: NEGATIVE

## 2024-09-04 ENCOUNTER — Other Ambulatory Visit

## 2024-09-04 ENCOUNTER — Ambulatory Visit

## 2024-09-04 ENCOUNTER — Other Ambulatory Visit: Payer: Self-pay

## 2024-09-04 VITALS — BP 118/71 | HR 99 | Wt 269.1 lb

## 2024-09-04 DIAGNOSIS — Z3A39 39 weeks gestation of pregnancy: Secondary | ICD-10-CM | POA: Diagnosis not present

## 2024-09-04 DIAGNOSIS — O0990 Supervision of high risk pregnancy, unspecified, unspecified trimester: Secondary | ICD-10-CM

## 2024-09-04 DIAGNOSIS — Z6841 Body Mass Index (BMI) 40.0 and over, adult: Secondary | ICD-10-CM

## 2024-09-04 DIAGNOSIS — O24419 Gestational diabetes mellitus in pregnancy, unspecified control: Secondary | ICD-10-CM

## 2024-09-04 DIAGNOSIS — Z3A35 35 weeks gestation of pregnancy: Secondary | ICD-10-CM

## 2024-09-04 NOTE — Progress Notes (Signed)
   HIGH-RISK PREGNANCY OFFICE VISIT  Patient name: Taylor Colon MRN [id number]  Date of birth: March 06, 1996 Chief Complaint:   Routine Prenatal Visit (39 weeks ROB)  Subjective:   Taylor Colon is a 28 y.o. [phone] female at [redacted]w[redacted]d with an Estimated Date of Delivery: 09/10/24 being seen today for ongoing management of a high-risk pregnancy aeb has Pyelonephritis complicating pregnancy, first trimester; Hypokalemia; Supervision of high risk pregnancy, antepartum; Short interval between pregnancies affecting pregnancy, antepartum; History of diet controlled gestational diabetes mellitus (GDM); BMI 40.0-44.9, adult (HCC); History of 2 cesarean sections; Gestational diabetes mellitus (GDM) affecting pregnancy, antepartum; and JME (juvenile myoclonic epilepsy) (HCC) on their problem list.  Patient presents today, alone, with c/o contractions.  Patient endorses fetal movement. Patient reports abdominal contractions that started last night (0320) and lasted until 0430.  Patient denies vaginal concerns including abnormal discharge, leaking of fluid, and bleeding. No issues with urination, constipation, or diarrhea.    Contractions: Irregular. Vag. Bleeding: None.  Movement: Present.  Reviewed past medical,surgical, social, obstetrical and family history as well as problem list, medications and allergies.  Objective   Vitals:   09/04/24 1548  BP: 118/71  Pulse: 99  Weight: 269 lb 1.9 oz (122.1 kg)  Body mass index is 49.22 kg/m.  Total Weight Gain:31 lb 1.9 oz (14.1 kg)         Physical Examination:   General appearance: Well appearing, and in no distress  Mental status: Alert, oriented to person, place, and time  Skin: Warm & dry  Cardiovascular: Normal heart rate noted  Respiratory: Normal respiratory effort, no distress  Abdomen: Soft, gravid, nontender, AGA with Fundal Height: 40 cm  Pelvic: Cervical exam deferred           Extremities: Edema: None  Fetal Status: Fetal Heart Rate (bpm):  146  Movement: Present   No results found for this or any previous visit (from the past 24 hours).   Assessment & Plan   High-risk pregnancy of a 28 y.o., [id number] at [redacted]w[redacted]d with an Estimated Date of Delivery: 09/10/24   1. Supervision of high risk pregnancy, antepartum -Anticipatory guidance for upcoming appts. -Patient to schedule next appt in 5-6 weeks for a PP visit. -Scheduled for rC/S on 12/5 and incision check on 12/10.   2. [redacted] weeks gestation of pregnancy -Doing well. -Instructed to go to MAU if contractions become regular.  -Briefly discussed usage of Depo Provera for [facility]. -Informed that she can receive initial dose prior to hospital discharge.        Meds: No orders of the defined types were placed in this encounter.  Labs/procedures today:  Lab Orders  No laboratory test(s) ordered today     Reviewed: Term labor symptoms and general obstetric precautions including but not limited to vaginal bleeding, contractions, leaking of fluid and fetal movement were reviewed in detail with the patient.  All questions were answered.  Follow-up: No follow-ups on file.  No orders of the defined types were placed in this encounter.  Harlene LITTIE Duncans MSN, CNM 09/04/2024

## 2024-09-07 ENCOUNTER — Other Ambulatory Visit: Payer: Self-pay | Admitting: Family Medicine

## 2024-09-07 ENCOUNTER — Encounter (HOSPITAL_COMMUNITY): Admission: RE | Admit: 2024-09-07 | Discharge: 2024-09-07 | Disposition: A | Source: Ambulatory Visit

## 2024-09-07 ENCOUNTER — Encounter (HOSPITAL_COMMUNITY): Payer: Self-pay

## 2024-09-07 ENCOUNTER — Encounter (HOSPITAL_COMMUNITY)
Admission: RE | Admit: 2024-09-07 | Discharge: 2024-09-07 | Disposition: A | Discharge status: Home / Self Care | Source: Ambulatory Visit | Attending: Family Medicine | Admitting: Family Medicine

## 2024-09-07 DIAGNOSIS — Z98891 History of uterine scar from previous surgery: Secondary | ICD-10-CM

## 2024-09-07 NOTE — Patient Instructions (Addendum)
 Taylor Colon  09/07/2024   Your procedure is scheduled on:  09/10/2024  Arrive at 0645 at Entrance C on [address] [facility]  and [address]. You are invited to use the FREE valet parking or use the Visitor's parking deck.  Pick up the phone at the desk and dial [phone].  Call this number if you have problems the morning of surgery: [phone]  Remember:   Do not eat food:(After Midnight) Desps de medianoche.  You may drink clear liquids until  ___0545__.  Clear liquids means a liquid you can see thru.  It can have color such as Cola or Kool aid.  Tea is OK and coffee as long as no milk or creamer of any kind.  Take these medicines the morning of surgery with A SIP OF WATER:  Take kepra, zoloft  and macrobid  as prescribed   Do not wear jewelry, make-up or nail polish.  Do not wear lotions, powders, or perfumes. Do not wear deodorant.  Do not shave 48 hours prior to surgery.  Do not bring valuables to the hospital.  [facility] [place] is not   responsible for any belongings or valuables brought to the hospital.  Contacts, dentures or bridgework may not be worn into surgery.  Leave suitcase in the car. After surgery it may be brought to your room.  For patients admitted to the hospital, checkout time is 11:00 AM the day of              discharge.      Please read over the following fact sheets that you were given:     Preparing for Surgery

## 2024-09-09 ENCOUNTER — Encounter (HOSPITAL_COMMUNITY): Payer: Self-pay | Admitting: Family Medicine

## 2024-09-10 ENCOUNTER — Other Ambulatory Visit: Payer: Self-pay

## 2024-09-10 ENCOUNTER — Inpatient Hospital Stay (HOSPITAL_COMMUNITY): Admitting: Anesthesiology

## 2024-09-10 ENCOUNTER — Encounter (HOSPITAL_COMMUNITY): Payer: Self-pay

## 2024-09-10 ENCOUNTER — Encounter (HOSPITAL_COMMUNITY): Payer: Self-pay | Admitting: Family Medicine

## 2024-09-10 ENCOUNTER — Other Ambulatory Visit (HOSPITAL_COMMUNITY)

## 2024-09-10 ENCOUNTER — Encounter (HOSPITAL_COMMUNITY): Admission: RE | Disposition: A | Payer: Self-pay | Source: Home / Self Care | Attending: Family Medicine

## 2024-09-10 ENCOUNTER — Inpatient Hospital Stay (HOSPITAL_COMMUNITY)
Admission: RE | Admit: 2024-09-10 | Discharge: 2024-09-13 | DRG: 787 | Disposition: A | Attending: Family Medicine | Admitting: Family Medicine

## 2024-09-10 DIAGNOSIS — O134 Gestational [pregnancy-induced] hypertension without significant proteinuria, complicating childbirth: Secondary | ICD-10-CM | POA: Diagnosis not present

## 2024-09-10 DIAGNOSIS — Z98891 History of uterine scar from previous surgery: Secondary | ICD-10-CM

## 2024-09-10 DIAGNOSIS — O24419 Gestational diabetes mellitus in pregnancy, unspecified control: Secondary | ICD-10-CM | POA: Diagnosis present

## 2024-09-10 DIAGNOSIS — O34211 Maternal care for low transverse scar from previous cesarean delivery: Secondary | ICD-10-CM | POA: Diagnosis not present

## 2024-09-10 DIAGNOSIS — O09899 Supervision of other high risk pregnancies, unspecified trimester: Secondary | ICD-10-CM

## 2024-09-10 DIAGNOSIS — O24420 Gestational diabetes mellitus in childbirth, diet controlled: Secondary | ICD-10-CM | POA: Diagnosis not present

## 2024-09-10 DIAGNOSIS — Z3A40 40 weeks gestation of pregnancy: Secondary | ICD-10-CM

## 2024-09-10 DIAGNOSIS — G40B09 Juvenile myoclonic epilepsy, not intractable, without status epilepticus: Secondary | ICD-10-CM | POA: Diagnosis present

## 2024-09-10 LAB — TYPE AND SCREEN
ABO/RH(D): A POS
Antibody Screen: NEGATIVE

## 2024-09-10 LAB — CBC
HCT: 39.3 % (ref 36.0–46.0)
Hemoglobin: 13.2 g/dL (ref 12.0–15.0)
MCH: 28.5 pg (ref 26.0–34.0)
MCHC: 33.6 g/dL (ref 30.0–36.0)
MCV: 84.9 fL (ref 80.0–100.0)
Platelets: 164 K/uL (ref 150–400)
RBC: 4.63 MIL/uL (ref 3.87–5.11)
RDW: 14.6 % (ref 11.5–15.5)
WBC: 5.9 K/uL (ref 4.0–10.5)
nRBC: 0 % (ref 0.0–0.2)

## 2024-09-10 LAB — GLUCOSE, CAPILLARY
Glucose-Capillary: 86 mg/dL (ref 70–99)
Glucose-Capillary: 108 mg/dL — ABNORMAL HIGH (ref 70–99)

## 2024-09-10 LAB — SYPHILIS: RPR W/REFLEX TO RPR TITER AND TREPONEMAL ANTIBODIES, TRADITIONAL SCREENING AND DIAGNOSIS ALGORITHM: RPR Ser Ql: NONREACTIVE

## 2024-09-10 SURGERY — Surgical Case
Anesthesia: Spinal | Site: Abdomen

## 2024-09-10 MED ORDER — ACETAMINOPHEN 10 MG/ML IV SOLN
INTRAVENOUS | Status: AC
  Filled 2024-09-10: qty 100

## 2024-09-10 MED ORDER — OXYCODONE HCL 5 MG PO TABS: 5.0000 mg | ORAL_TABLET | Freq: Once | ORAL | Status: DC | PRN

## 2024-09-10 MED ORDER — DIBUCAINE (PERIANAL) 1 % EX OINT: 1.0000 | TOPICAL_OINTMENT | CUTANEOUS | Status: DC | PRN

## 2024-09-10 MED ORDER — CEFAZOLIN SODIUM-DEXTROSE 3-4 GM/150ML-% IV SOLN
INTRAVENOUS | Status: AC
  Filled 2024-09-10: qty 150

## 2024-09-10 MED ORDER — GLYCOPYRROLATE PF 0.2 MG/ML IJ SOSY
PREFILLED_SYRINGE | INTRAMUSCULAR | Status: AC
  Filled 2024-09-10: qty 1

## 2024-09-10 MED ORDER — COCONUT OIL OIL: 1.0000 | TOPICAL_OIL | Status: DC | PRN

## 2024-09-10 MED ORDER — MENTHOL 3 MG MT LOZG: 1.0000 | LOZENGE | OROMUCOSAL | Status: DC | PRN

## 2024-09-10 MED ORDER — BUPIVACAINE IN DEXTROSE 0.75-8.25 % IT SOLN
INTRATHECAL | Status: DC | PRN
  Administered 2024-09-10: 1.6 mL via INTRATHECAL

## 2024-09-10 MED ORDER — OXYTOCIN-SODIUM CHLORIDE 30-0.9 UT/500ML-% IV SOLN
2.5000 [IU]/h | INTRAVENOUS | Status: AC
  Administered 2024-09-10: 2.5 [IU]/h via INTRAVENOUS
  Filled 2024-09-10: qty 500

## 2024-09-10 MED ORDER — PRENATAL MULTIVITAMIN CH
1.0000 | ORAL_TABLET | Freq: Every day | ORAL | Status: DC
  Administered 2024-09-10 – 2024-09-13 (×4): 1 via ORAL
  Filled 2024-09-10 (×4): qty 1

## 2024-09-10 MED ORDER — KETOROLAC TROMETHAMINE 30 MG/ML IJ SOLN
INTRAMUSCULAR | Status: AC
  Filled 2024-09-10: qty 1

## 2024-09-10 MED ORDER — DIPHENHYDRAMINE HCL 50 MG/ML IJ SOLN
INTRAMUSCULAR | Status: AC
  Filled 2024-09-10: qty 1

## 2024-09-10 MED ORDER — SODIUM CHLORIDE 0.9 % IR SOLN
Status: DC | PRN
  Administered 2024-09-10: 1

## 2024-09-10 MED ORDER — STERILE WATER FOR IRRIGATION IR SOLN
Status: DC | PRN
  Administered 2024-09-10: 1

## 2024-09-10 MED ORDER — OXYTOCIN-SODIUM CHLORIDE 30-0.9 UT/500ML-% IV SOLN
INTRAVENOUS | Status: AC
  Filled 2024-09-10: qty 500

## 2024-09-10 MED ORDER — TETANUS-DIPHTH-ACELL PERTUSSIS 5-2-15.5 LF-MCG/0.5 IM SUSP: 0.5000 mL | Freq: Once | INTRAMUSCULAR | Status: DC

## 2024-09-10 MED ORDER — PHENYLEPHRINE HCL-NACL 20-0.9 MG/250ML-% IV SOLN
INTRAVENOUS | Status: DC | PRN
  Administered 2024-09-10: 60 ug/min via INTRAVENOUS

## 2024-09-10 MED ORDER — DIPHENHYDRAMINE HCL 25 MG PO CAPS
25.0000 mg | ORAL_CAPSULE | ORAL | Status: DC | PRN
  Administered 2024-09-10 – 2024-09-12 (×2): 25 mg via ORAL
  Filled 2024-09-10 (×2): qty 1

## 2024-09-10 MED ORDER — OXYCODONE HCL 5 MG/5ML PO SOLN: 5.0000 mg | Freq: Once | ORAL | Status: DC | PRN

## 2024-09-10 MED ORDER — FENTANYL CITRATE (PF) 100 MCG/2ML IJ SOLN
INTRAMUSCULAR | Status: AC
  Filled 2024-09-10: qty 2

## 2024-09-10 MED ORDER — EPHEDRINE 5 MG/ML INJ
INTRAVENOUS | Status: AC
  Filled 2024-09-10: qty 5

## 2024-09-10 MED ORDER — EPHEDRINE SULFATE-NACL 50-0.9 MG/10ML-% IV SOSY
PREFILLED_SYRINGE | INTRAVENOUS | Status: DC | PRN
  Administered 2024-09-10: 10 mg via INTRAVENOUS

## 2024-09-10 MED ORDER — CEFAZOLIN SODIUM-DEXTROSE 2-4 GM/100ML-% IV SOLN: 2.0000 g | INTRAVENOUS | Status: DC

## 2024-09-10 MED ORDER — METOCLOPRAMIDE HCL 5 MG/ML IJ SOLN
INTRAMUSCULAR | Status: AC
  Filled 2024-09-10: qty 2

## 2024-09-10 MED ORDER — SENNOSIDES-DOCUSATE SODIUM 8.6-50 MG PO TABS
2.0000 | ORAL_TABLET | Freq: Every day | ORAL | Status: DC
  Administered 2024-09-11 – 2024-09-13 (×3): 2 via ORAL
  Filled 2024-09-10 (×3): qty 2

## 2024-09-10 MED ORDER — LEVETIRACETAM 500 MG PO TABS
500.0000 mg | ORAL_TABLET | Freq: Every day | ORAL | Status: DC
  Administered 2024-09-11 – 2024-09-13 (×3): 500 mg via ORAL
  Filled 2024-09-10 (×5): qty 1

## 2024-09-10 MED ORDER — LIDOCAINE 2% (20 MG/ML) 5 ML SYRINGE
INTRAMUSCULAR | Status: AC
  Filled 2024-09-10: qty 5

## 2024-09-10 MED ORDER — NALOXONE HCL 4 MG/10ML IJ SOLN: 1.0000 ug/kg/h | INTRAVENOUS | Status: DC | PRN

## 2024-09-10 MED ORDER — GABAPENTIN 100 MG PO CAPS
100.0000 mg | ORAL_CAPSULE | Freq: Three times a day (TID) | ORAL | Status: DC
  Administered 2024-09-10 – 2024-09-13 (×8): 100 mg via ORAL
  Filled 2024-09-10 (×9): qty 1

## 2024-09-10 MED ORDER — FENTANYL CITRATE (PF) 100 MCG/2ML IJ SOLN
INTRAMUSCULAR | Status: DC | PRN
  Administered 2024-09-10: 15 ug via INTRATHECAL

## 2024-09-10 MED ORDER — ARTIFICIAL TEARS OPHTHALMIC OINT
TOPICAL_OINTMENT | OPHTHALMIC | Status: AC
  Filled 2024-09-10: qty 3.5

## 2024-09-10 MED ORDER — WITCH HAZEL-GLYCERIN EX PADS: 1.0000 | MEDICATED_PAD | CUTANEOUS | Status: DC | PRN

## 2024-09-10 MED ORDER — DIPHENHYDRAMINE HCL 50 MG/ML IJ SOLN
12.5000 mg | INTRAMUSCULAR | Status: DC | PRN
  Administered 2024-09-10: 12.5 mg via INTRAVENOUS

## 2024-09-10 MED ORDER — ACETAMINOPHEN 500 MG PO TABS
1000.0000 mg | ORAL_TABLET | Freq: Four times a day (QID) | ORAL | Status: DC
  Administered 2024-09-10 – 2024-09-13 (×11): 1000 mg via ORAL
  Filled 2024-09-10 (×12): qty 2

## 2024-09-10 MED ORDER — HYDROMORPHONE HCL 1 MG/ML IJ SOLN: 0.2500 mg | INTRAMUSCULAR | Status: DC | PRN

## 2024-09-10 MED ORDER — MEPERIDINE HCL 25 MG/ML IJ SOLN: 6.2500 mg | INTRAMUSCULAR | Status: DC | PRN

## 2024-09-10 MED ORDER — SODIUM CHLORIDE 0.9% FLUSH: 3.0000 mL | INTRAVENOUS | Status: DC | PRN

## 2024-09-10 MED ORDER — SCOPOLAMINE 1 MG/3DAYS TD PT72
MEDICATED_PATCH | TRANSDERMAL | Status: AC
  Filled 2024-09-10: qty 2

## 2024-09-10 MED ORDER — PHENYLEPHRINE HCL-NACL 20-0.9 MG/250ML-% IV SOLN
INTRAVENOUS | Status: AC
  Filled 2024-09-10: qty 250

## 2024-09-10 MED ORDER — POVIDONE-IODINE 10 % EX SWAB
2.0000 | Freq: Once | CUTANEOUS | Status: AC
  Administered 2024-09-10: 2 via TOPICAL

## 2024-09-10 MED ORDER — LACTATED RINGERS IV SOLN: INTRAVENOUS | Status: DC

## 2024-09-10 MED ORDER — MORPHINE SULFATE (PF) 0.5 MG/ML IJ SOLN
INTRAMUSCULAR | Status: DC | PRN
  Administered 2024-09-10: 150 ug via INTRATHECAL

## 2024-09-10 MED ORDER — ONDANSETRON HCL 4 MG/2ML IJ SOLN
INTRAMUSCULAR | Status: AC
  Filled 2024-09-10: qty 2

## 2024-09-10 MED ORDER — MEDROXYPROGESTERONE ACETATE 150 MG/ML IM SUSP: 150.0000 mg | INTRAMUSCULAR | Status: DC | PRN

## 2024-09-10 MED ORDER — ENOXAPARIN SODIUM 40 MG/0.4ML IJ SOSY
40.0000 mg | PREFILLED_SYRINGE | INTRAMUSCULAR | Status: DC
  Administered 2024-09-11 – 2024-09-12 (×2): 40 mg via SUBCUTANEOUS
  Filled 2024-09-10 (×3): qty 0.4

## 2024-09-10 MED ORDER — DEXAMETHASONE SOD PHOSPHATE PF 10 MG/ML IJ SOLN
INTRAMUSCULAR | Status: DC | PRN
  Administered 2024-09-10 (×2): 5 mg via INTRAVENOUS

## 2024-09-10 MED ORDER — SIMETHICONE 80 MG PO CHEW: 80.0000 mg | CHEWABLE_TABLET | ORAL | Status: DC | PRN

## 2024-09-10 MED ORDER — ACETAMINOPHEN 10 MG/ML IV SOLN
INTRAVENOUS | Status: DC | PRN
  Administered 2024-09-10: 1000 mg via INTRAVENOUS

## 2024-09-10 MED ORDER — SIMETHICONE 80 MG PO CHEW
80.0000 mg | CHEWABLE_TABLET | Freq: Three times a day (TID) | ORAL | Status: DC
  Administered 2024-09-10 – 2024-09-13 (×8): 80 mg via ORAL
  Filled 2024-09-10 (×8): qty 1

## 2024-09-10 MED ORDER — TRANEXAMIC ACID-NACL 1000-0.7 MG/100ML-% IV SOLN
INTRAVENOUS | Status: AC
  Filled 2024-09-10: qty 100

## 2024-09-10 MED ORDER — OXYCODONE HCL 5 MG PO TABS
5.0000 mg | ORAL_TABLET | ORAL | Status: DC | PRN
  Administered 2024-09-11: 5 mg via ORAL
  Administered 2024-09-12: 10 mg via ORAL
  Filled 2024-09-10: qty 1
  Filled 2024-09-10: qty 2

## 2024-09-10 MED ORDER — ONDANSETRON HCL 4 MG/2ML IJ SOLN
INTRAMUSCULAR | Status: DC | PRN
  Administered 2024-09-10: 4 mg via INTRAVENOUS

## 2024-09-10 MED ORDER — IBUPROFEN 800 MG PO TABS: 400.0000 mg | ORAL_TABLET | Freq: Four times a day (QID) | ORAL | Status: DC

## 2024-09-10 MED ORDER — KETOROLAC TROMETHAMINE 30 MG/ML IJ SOLN
30.0000 mg | Freq: Four times a day (QID) | INTRAMUSCULAR | Status: DC
  Administered 2024-09-10 – 2024-09-11 (×3): 30 mg via INTRAVENOUS
  Filled 2024-09-10 (×3): qty 1

## 2024-09-10 MED ORDER — NALOXONE HCL 0.4 MG/ML IJ SOLN: 0.4000 mg | INTRAMUSCULAR | Status: DC | PRN

## 2024-09-10 MED ORDER — MORPHINE SULFATE (PF) 0.5 MG/ML IJ SOLN
INTRAMUSCULAR | Status: AC
  Filled 2024-09-10: qty 10

## 2024-09-10 MED ORDER — KETOROLAC TROMETHAMINE 30 MG/ML IJ SOLN
30.0000 mg | Freq: Once | INTRAMUSCULAR | Status: AC | PRN
  Administered 2024-09-10: 30 mg via INTRAVENOUS

## 2024-09-10 MED ORDER — MEASLES, MUMPS & RUBELLA VAC ~~LOC~~ SUSR: 0.5000 mL | Freq: Once | SUBCUTANEOUS | Status: DC

## 2024-09-10 MED ORDER — SERTRALINE HCL 50 MG PO TABS
50.0000 mg | ORAL_TABLET | Freq: Every day | ORAL | Status: DC
  Administered 2024-09-10 – 2024-09-13 (×4): 50 mg via ORAL
  Filled 2024-09-10 (×4): qty 1

## 2024-09-10 MED ORDER — TRANEXAMIC ACID-NACL 1000-0.7 MG/100ML-% IV SOLN
1000.0000 mg | Freq: Once | INTRAVENOUS | Status: AC
  Administered 2024-09-10: 1000 mg via INTRAVENOUS

## 2024-09-10 MED ORDER — CEFAZOLIN SODIUM-DEXTROSE 3-4 GM/150ML-% IV SOLN
3.0000 g | INTRAVENOUS | Status: AC
  Administered 2024-09-10: 3 g via INTRAVENOUS

## 2024-09-10 MED ORDER — OXYTOCIN-SODIUM CHLORIDE 30-0.9 UT/500ML-% IV SOLN
INTRAVENOUS | Status: DC | PRN
  Administered 2024-09-10: 300 mL via INTRAVENOUS

## 2024-09-10 SURGICAL SUPPLY — 34 items
BENZOIN TINCTURE PRP APPL 2/3 (GAUZE/BANDAGES/DRESSINGS) IMPLANT
CHLORAPREP W/TINT 26 (MISCELLANEOUS) ×2 IMPLANT
CLAMP UMBILICAL CORD (MISCELLANEOUS) ×1 IMPLANT
CLOTH BEACON ORANGE TIMEOUT ST (SAFETY) ×1 IMPLANT
DERMABOND ADVANCED .7 DNX12 (GAUZE/BANDAGES/DRESSINGS) ×2 IMPLANT
DRSG OPSITE POSTOP 4X10 (GAUZE/BANDAGES/DRESSINGS) ×1 IMPLANT
ELECTRODE REM PT RTRN 9FT ADLT (ELECTROSURGICAL) ×1 IMPLANT
EXTENDER TRAXI PANNICULUS (MISCELLANEOUS) IMPLANT
EXTRACTOR VACUUM M CUP 4 TUBE (SUCTIONS) IMPLANT
GAUZE PAD ABD 7.5X8 STRL (GAUZE/BANDAGES/DRESSINGS) IMPLANT
GAUZE SPONGE 4X4 12PLY STRL LF (GAUZE/BANDAGES/DRESSINGS) IMPLANT
GLOVE BIOGEL PI IND STRL 7.0 (GLOVE) ×2 IMPLANT
GLOVE BIOGEL PI IND STRL 7.5 (GLOVE) ×2 IMPLANT
GLOVE ECLIPSE 7.5 STRL STRAW (GLOVE) ×1 IMPLANT
GOWN STRL REUS W/TWL LRG LVL3 (GOWN DISPOSABLE) ×3 IMPLANT
KIT ABG SYR 3ML LUER SLIP (SYRINGE) IMPLANT
MAT PREVALON FULL STRYKER (MISCELLANEOUS) IMPLANT
NDL HYPO 25X5/8 SAFETYGLIDE (NEEDLE) IMPLANT
NS IRRIG 1000ML POUR BTL (IV SOLUTION) ×1 IMPLANT
PACK C SECTION WH (CUSTOM PROCEDURE TRAY) ×1 IMPLANT
PAD OB MATERNITY 4.3X12.25 (PERSONAL CARE ITEMS) ×1 IMPLANT
RETAINER VISCERAL (MISCELLANEOUS) IMPLANT
RETRACTOR TRAXI PANNICULUS (MISCELLANEOUS) IMPLANT
RTRCTR C-SECT PINK 25CM LRG (MISCELLANEOUS) ×1 IMPLANT
STRIP CLOSURE SKIN 1/2X4 (GAUZE/BANDAGES/DRESSINGS) IMPLANT
SUT MNCRL 0 VIOLET CTX 36 (SUTURE) ×2 IMPLANT
SUT VIC AB 0 CTX36XBRD ANBCTRL (SUTURE) ×1 IMPLANT
SUT VIC AB 2-0 CT1 TAPERPNT 27 (SUTURE) ×1 IMPLANT
SUT VIC AB 4-0 KS 27 (SUTURE) ×1 IMPLANT
SUTURE PLAIN GUT 2.0 ETHICON (SUTURE) IMPLANT
TAPE CLOTH SURG 4X10 WHT LF (GAUZE/BANDAGES/DRESSINGS) IMPLANT
TOWEL OR 17X24 6PK STRL BLUE (TOWEL DISPOSABLE) ×1 IMPLANT
TRAY FOLEY W/BAG SLVR 14FR LF (SET/KITS/TRAYS/PACK) ×1 IMPLANT
WATER STERILE IRR 1000ML POUR (IV SOLUTION) ×1 IMPLANT

## 2024-09-10 NOTE — Anesthesia Procedure Notes (Signed)
 Spinal  Patient location during procedure: OB Start time: 09/10/2024 10:10 AM End time: 09/10/2024 10:15 AM Reason for block: surgical anesthesia Staffing Performed: anesthesiologist  Anesthesiologist: Cleotilde Butler Dade, MD Performed by: Cleotilde Butler Dade, MD Authorized by: Cleotilde Butler Dade, MD   Preanesthetic Checklist Completed: patient identified, IV checked, risks and benefits discussed, surgical consent, monitors and equipment checked, pre-op evaluation and timeout performed Spinal Block Patient position: sitting Prep: DuraPrep and site prepped and draped Patient monitoring: heart rate, cardiac monitor, continuous pulse ox and blood pressure Approach: midline Location: L3-4 Injection technique: single-shot Needle Needle type: Pencan and Other  Needle gauge: 24 G Needle length: 10 cm Assessment Sensory level: T4 Events: CSF return

## 2024-09-10 NOTE — Lactation Note (Signed)
 This note was copied from a baby's chart. Lactation Consultation Note  Patient Name: Taylor Colon Unijb'd Date: 09/10/2024 Age:30 hours   P3- LC attempted to consult with MOB, but she was asleep. LC will try again at a later time.  Recardo Hoit BS, IBCLC 09/10/2024, 5:37 PM

## 2024-09-10 NOTE — Anesthesia Preprocedure Evaluation (Addendum)
 Anesthesia Evaluation  Patient identified by MRN, date of birth, ID band Patient awake    Reviewed: Allergy & Precautions, H&P , NPO status , Patient's Chart, lab work & pertinent test results  Airway Mallampati: II  TM Distance: >3 FB Neck ROM: Full    Dental no notable dental hx.    Pulmonary neg pulmonary ROS, former smoker   Pulmonary exam normal breath sounds clear to auscultation       Cardiovascular negative cardio ROS Normal cardiovascular exam Rhythm:Regular Rate:Normal     Neuro/Psych negative neurological ROS  negative psych ROS   GI/Hepatic negative GI ROS, Neg liver ROS,,,  Endo/Other  diabetes  Class 3 obesity  Renal/GU negative Renal ROS  negative genitourinary   Musculoskeletal negative musculoskeletal ROS (+)    Abdominal  (+) + obese  Peds negative pediatric ROS (+)  Hematology negative hematology ROS (+)   Anesthesia Other Findings   Reproductive/Obstetrics (+) Pregnancy                              Anesthesia Physical Anesthesia Plan  ASA: 3  Anesthesia Plan: Spinal   Post-op Pain Management:    Induction:   PONV Risk Score and Plan: 2 and Treatment may vary due to age or medical condition  Airway Management Planned: Natural Airway  Additional Equipment:   Intra-op Plan:   Post-operative Plan:   Informed Consent: I have reviewed the patients History and Physical, chart, labs and discussed the procedure including the risks, benefits and alternatives for the proposed anesthesia with the patient or authorized representative who has indicated his/her understanding and acceptance.     Dental advisory given  Plan Discussed with: CRNA  Anesthesia Plan Comments:         Anesthesia Quick Evaluation

## 2024-09-10 NOTE — Lactation Note (Signed)
 This note was copied from a baby's chart. Lactation Consultation Note  Patient Name: Taylor Colon Unijb'd Date: 09/10/2024 Age:30 hours Reason for consult: Initial assessment;Term;Maternal endocrine disorder  P3- MOB reports that infant has been latching great so far. MOB denies having any pain or discomfort. Per MOB request, LC reviewed when and how to initiate pumping when she needs to go back to work. LC flange sized MOB at a 27 mm flange and provided her with a manual pump per request.  LC reviewed the first 24 hr birthday nap, day 2 cluster feeding, feeding infant on cue 8-12x in 24 hrs, not allowing infant to go over 3 hrs without a feeding, CDC milk storage guidelines, LC services handout and engorgement/breast care. LC encouraged MOB to call for further assistance as needed.  Maternal Data Has patient been taught Hand Expression?: No Does the patient have breastfeeding experience prior to this delivery?: Yes How long did the patient breastfeed?: 18 months with first child and 7 months with second (milk dried up when she went back to work)  Dentist Current Feeding Choice: Breast Milk  Lactation Tools Discussed/Used Tools: Pump;Flanges Flange Size: 24;27 (27mm better fit) Breast pump type: Manual Pump Education: Setup, frequency, and cleaning;Milk Storage Reason for Pumping: MOB request Pumping frequency: 15-20 min every 3 hrs  Interventions Interventions: Breast feeding basics reviewed;Hand pump;Education;LC Services brochure  Discharge Discharge Education: Engorgement and breast care;Warning signs for feeding baby Pump: DEBP;Manual;Personal (Medela)  Consult Status Consult Status: Follow-up Date: 09/11/24 Follow-up type: In-patient    Recardo Hoit BS, IBCLC 09/10/2024, 8:54 PM

## 2024-09-10 NOTE — Anesthesia Postprocedure Evaluation (Signed)
 Anesthesia Post Note  Patient: Taylor Colon  Procedure(s) Performed: CESAREAN DELIVERY (Abdomen)     Patient location during evaluation: PACU Anesthesia Type: Spinal Level of consciousness: awake and alert Pain management: pain level controlled Vital Signs Assessment: post-procedure vital signs reviewed and stable Respiratory status: spontaneous breathing, nonlabored ventilation and respiratory function stable Cardiovascular status: blood pressure returned to baseline and stable Postop Assessment: no apparent nausea or vomiting Anesthetic complications: no   No notable events documented.  Last Vitals:  Vitals:   09/10/24 1300 09/10/24 1315  BP: 129/81 122/63  Pulse: 85 85  Resp: 16 16  Temp:    SpO2: 94% 97%    Last Pain:  Vitals:   09/10/24 1315  TempSrc:   PainSc: 3    Pain Goal:    LLE Motor Response: Purposeful movement (09/10/24 1315) LLE Sensation: Full sensation (09/10/24 1315) RLE Motor Response: Purposeful movement (09/10/24 1315) RLE Sensation: Full sensation (09/10/24 1315)     Epidural/Spinal Function Cutaneous sensation: Able to Discern Pressure (09/10/24 1315), Patient able to flex knees: Yes (09/10/24 1315), Patient able to lift hips off bed: Yes (09/10/24 1315), Back pain beyond tenderness at insertion site: No (09/10/24 1315), Progressively worsening motor and/or sensory loss: No (09/10/24 1315), Bowel and/or bladder incontinence post epidural: No (09/10/24 1315)  Butler Levander Pinal

## 2024-09-10 NOTE — Op Note (Addendum)
 Jenah N Grupe PROCEDURE DATE: 09/10/2024  PREOPERATIVE DIAGNOSIS: Intrauterine pregnancy at  [redacted]w[redacted]d weeks gestation; previous uterine incision kerr x2  POSTOPERATIVE DIAGNOSIS: The same  PROCEDURE: repeat Low Transverse Cesarean Section  SURGEON:  Dr. Lang Peel  ASSISTANT: Dr Barabara Maier    INDICATIONS: Taylor Colon is a 28 y.o. H5E6986 at [redacted]w[redacted]d scheduled for cesarean section secondary to previous uterine incision kerr x2.  The risks of cesarean section discussed with the patient included but were not limited to: bleeding which may require transfusion or reoperation; infection which may require antibiotics; injury to bowel, bladder, ureters or other surrounding organs; injury to the fetus; need for additional procedures including hysterectomy in the event of a life-threatening hemorrhage; placental abnormalities wth subsequent pregnancies, incisional problems, thromboembolic phenomenon and other postoperative/anesthesia complications. The patient concurred with the proposed plan, giving informed written consent for the procedure.    FINDINGS:  Viable female infant in vertex presentation.  Apgars 8 and 8, weight, 3630g.  Clear amniotic fluid.  Intact placenta, three vessel cord.  Normal uterus, fallopian tubes and ovaries bilaterally.  ANESTHESIA:    Spinal INTRAVENOUS FLUIDS: 2000 ml ESTIMATED BLOOD LOSS: 275 ml URINE OUTPUT:  75 ml SPECIMENS: Placenta sent to  pathology COMPLICATIONS: None immediate  PROCEDURE IN DETAIL:  The patient received intravenous antibiotics and had sequential compression devices applied to her lower extremities while in the preoperative area.  She was then taken to the operating room where spinal anesthesia was administered (epidural anesthesia was dosed up to surgical level) and was found to be adequate. She was then placed in a dorsal supine position with a leftward tilt, and prepped and draped in a sterile manner.  A foley catheter was placed into her  bladder and attached to constant gravity, which drained clear fluid throughout.  After an adequate timeout was performed, a Pfannenstiel skin incision was made with scalpel and carried through to the underlying layer of fascia. The fascia was incised in the midline and this incision was extended bilaterally using the Mayo scissors. Kocher clamps were applied to the superior aspect of the fascial incision and the underlying rectus muscles were dissected off bluntly. A similar process was carried out on the inferior aspect of the facial incision. The rectus muscles were separated in the midline bluntly and the peritoneum was entered bluntly. An Alexis retractor was placed to aid in visualization of the uterus.  Attention was turned to the lower uterine segment where a Bandl's ring was noted. A transverse hysterotomy was made inferior to this using a scalpel and extended bilaterally bluntly. The infant was successfully delivered, and cord was clamped and cut and infant was handed over to awaiting neonatology team. Uterine massage was then administered and the placenta delivered intact with three-vessel cord. The uterus was then cleared of clot and debris.  The hysterotomy was closed with 0 monocryl in a running fashion. Overall, excellent hemostasis was noted. The abdomen and the pelvis were cleared of all clot and debris and the Thersia was removed. Hemostasis was confirmed on all surfaces.  The peritoneum was reapproximated using 2-0 vicryl running stitches. The fascia was then closed using 0 Vicryl in a running fashion. The subcutaneous layer was reapproximated with plain gut and the skin was closed with 4-0 vicryl. The patient tolerated the procedure well. Sponge, lap, instrument and needle counts were correct x 2. She was taken to the recovery room in stable condition.    Maier Barabara, DO 09/10/2024 11:44 AM  Attestation of Attending Supervision of Obstetric Fellow During Surgery: Surgery was performed by  the Obstetric Fellow under my supervision and collaboration.  I have reviewed the Obstetric Fellow's operative report, and I agree with the documentation.  I was present and scrubbed for the entire procedure.    Cedarius Kersh, DO Attending Physician Faculty Practice, [facility] [place]

## 2024-09-10 NOTE — H&P (Signed)
 Faculty Practice H&P  Taylor Colon is a 28 y.o. female [phone] with IUP at [redacted]w[redacted]d presenting for repeat cesarean section. Pregnancy was been complicated by BMI 40, 2 prior cesareans, GDM, seizure disorder, pyelonephritis.    Pt states she has been having no contractions, no vaginal bleeding, intact membranes, with normal fetal movement.     Prenatal Course Source of Care: [facility] with onset of care at 18weeks  Pregnancy complications or risks: Patient Active Problem List   Diagnosis Date Noted   Gestational diabetes mellitus (GDM) affecting pregnancy, antepartum 07/03/2024   Short interval between pregnancies affecting pregnancy, antepartum 04/11/2024   History of diet controlled gestational diabetes mellitus (GDM) 04/11/2024   BMI 40.0-44.9, adult (HCC) 04/11/2024   Supervision of high risk pregnancy, antepartum 04/03/2024   Hypokalemia 02/13/2024   Pyelonephritis complicating pregnancy, first trimester 02/12/2024   History of 2 cesarean sections 08/01/2017   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  She plans to breastfeed, plans to bottle feed  Prenatal labs and studies: ABO, Rh: --/--/A POS (12/08 0745) Antibody: NEG (12/08 0745) Rubella: 1.12 (11/26 1342) RPR: Non Reactive (09/22 0852)  HBsAg: Negative (11/26 1342)  HIV: Non Reactive (09/22 0852)  GBS: Negative/-- (11/26 1331)  2hr Glucola: positive Genetic screening: normal Anatomy US : normal  Past Medical History:  Past Medical History:  Diagnosis Date   Gestational diabetes 2024   Pyelonephritis affecting pregnancy in first trimester 02/12/2024   Seizure (HCC)    Wears glasses     Past Surgical History:  Past Surgical History:  Procedure Laterality Date   CESAREAN SECTION     2018 and 2024    Obstetrical History:  OB History     Gravida  4   Para  2   Term  2   Preterm      AB  1   Living  2      SAB  1   IAB      Ectopic       Multiple      Live Births  2           Gynecological History:  OB History     Gravida  4   Para  2   Term  2   Preterm      AB  1   Living  2      SAB  1   IAB      Ectopic      Multiple      Live Births  2           Social History:  Social History   Socioeconomic History   Marital status: Single    Spouse name: Not on file   Number of children: Not on file   Years of education: Not on file   Highest education level: Not on file  Occupational History   Occupation: Teacher  Tobacco Use   Smoking status: Former    Types: E-cigarettes    Quit date: 2018    Years since quitting: 7.9   Smokeless tobacco: Never  Vaping Use   Vaping status: Former  Substance and Sexual Activity   Alcohol use: Not Currently    Comment: occ   Drug use: No   Sexual activity: Yes  Other Topics Concern   Not on file  Social History Narrative   Not on file   Social Drivers of Health   Financial Resource Strain: Not on  file  Food Insecurity: No Food Insecurity (02/13/2024)   Hunger Vital Sign    Worried About Running Out of Food in the Last Year: Never true    Ran Out of Food in the Last Year: Never true  Transportation Needs: No Transportation Needs (02/13/2024)   PRAPARE - Administrator, Civil Service (Medical): No    Lack of Transportation (Non-Medical): No  Physical Activity: Not on file  Stress: Not on file  Social Connections: Not on file    Family History:  Family History  Problem Relation Age of Onset   Healthy Mother    Diabetes Father    Healthy Maternal Grandmother    Hypertension Maternal Grandfather     Medications:  Prenatal vitamins,  Current Facility-Administered Medications  Medication Dose Route Frequency Provider Last Rate Last Admin   ceFAZolin  (ANCEF ) IVPB 3g/150 mL premix  3 g Intravenous On Call to OR Dai Apel J, DO       lactated ringers  infusion   Intravenous Continuous Lawton Dollinger J, DO 125 mL/hr at  09/10/24 0830 New Bag at 09/10/24 0830   tranexamic acid  (CYKLOKAPRON ) IVPB 1,000 mg  1,000 mg Intravenous Once Veleta Yamamoto J, DO        Allergies: No Known Allergies  Review of Systems: - negative  Physical Exam: Blood pressure 132/83, pulse (!) 104, temperature 98.2 F (36.8 C), temperature source Oral, resp. rate 17, height 5' 2 (1.575 m), weight 123.3 kg, last menstrual period 12/05/2023, SpO2 98%, unknown if currently breastfeeding. GENERAL: Well-developed, well-nourished female in no acute distress.  LUNGS: Clear to auscultation bilaterally.  HEART: Regular rate and rhythm. ABDOMEN: Soft, nontender, nondistended, gravid. EFW 6.5 lbs EXTREMITIES: Nontender, no edema, 2+ distal pulses.   Pertinent Labs/Studies:   Lab Results  Component Value Date   WBC 5.9 09/10/2024   HGB 13.2 09/10/2024   HCT 39.3 09/10/2024   MCV 84.9 09/10/2024   PLT 164 09/10/2024    Assessment : Taylor Colon is a 28 y.o. H5E7987 at [redacted]w[redacted]d being admitted for cesarean section secondary to elective repeat  Plan: The risks of cesarean section discussed with the patient included but were not limited to: bleeding which may require transfusion or reoperation; infection which may require antibiotics; injury to bowel, bladder, ureters or other surrounding organs; injury to the fetus; need for additional procedures including hysterectomy in the event of a life-threatening hemorrhage; placental abnormalities wth subsequent pregnancies, incisional problems, thromboembolic phenomenon and other postoperative/anesthesia complications. The patient concurred with the proposed plan, giving informed written consent for the procedure.   Patient has been NPO since last night and will remain NPO for procedure.  Preoperative prophylactic Ancef  ordered on call to the OR.    Shadonna Benedick J, DO 09/10/2024, 9:37 AM

## 2024-09-10 NOTE — Transfer of Care (Signed)
 Immediate Anesthesia Transfer of Care Note  Patient: Taylor Colon  Procedure(s) Performed: CESAREAN DELIVERY (Abdomen)  Patient Location: PACU  Anesthesia Type:Spinal  Level of Consciousness: awake  Airway & Oxygen Therapy: Patient Spontanous Breathing  Post-op Assessment: Report given to RN and Post -op Vital signs reviewed and stable  Post vital signs: Reviewed and stable  Last Vitals:  Vitals Value Taken Time  BP 126/72 09/10/24 12:00  Temp    Pulse 93 09/10/24 12:02  Resp 20 09/10/24 12:02  SpO2 98 % 09/10/24 12:02  Vitals shown include unfiled device data.  Last Pain:  Vitals:   09/10/24 0824  TempSrc: Oral         Complications: No notable events documented.

## 2024-09-11 LAB — CBC
HCT: 32.6 % — ABNORMAL LOW (ref 36.0–46.0)
Hemoglobin: 11 g/dL — ABNORMAL LOW (ref 12.0–15.0)
MCH: 29.6 pg (ref 26.0–34.0)
MCHC: 33.7 g/dL (ref 30.0–36.0)
MCV: 87.9 fL (ref 80.0–100.0)
Platelets: 161 K/uL (ref 150–400)
RBC: 3.71 MIL/uL — ABNORMAL LOW (ref 3.87–5.11)
RDW: 14.5 % (ref 11.5–15.5)
WBC: 12.4 K/uL — ABNORMAL HIGH (ref 4.0–10.5)
nRBC: 0 % (ref 0.0–0.2)

## 2024-09-11 MED ORDER — IBUPROFEN 600 MG PO TABS
600.0000 mg | ORAL_TABLET | Freq: Four times a day (QID) | ORAL | Status: DC
  Administered 2024-09-11 – 2024-09-13 (×9): 600 mg via ORAL
  Filled 2024-09-11 (×9): qty 1

## 2024-09-11 MED ORDER — KETOROLAC TROMETHAMINE 30 MG/ML IJ SOLN: 30.0000 mg | Freq: Four times a day (QID) | INTRAMUSCULAR | Status: AC

## 2024-09-11 NOTE — Progress Notes (Addendum)
 POSTPARTUM PROGRESS NOTE POD #1  Subjective:  Taylor Colon is a 28 y.o. H5E6986 s/p rLTCS at [redacted]w[redacted]d.  No acute events overnight.  Pt denies problems with ambulating, voiding or po intake.  She denies nausea or vomiting.  Pain is well controlled.  She has had flatus. She has not had bowel movement.  Lochia Minimal. Baby on biliblanket and light at bedside  Objective: Blood pressure 139/80, pulse 90, temperature 97.9 F (36.6 C), temperature source Oral, resp. rate 17, height 5' 2 (1.575 m), weight 123.3 kg, last menstrual period 12/05/2023, SpO2 97%, unknown if currently breastfeeding.  Physical Exam:  General: alert, cooperative and no distress Chest: no respiratory distress Heart:regular rate, distal pulses intact Abdomen: soft, nontender,  Uterine Fundus: firm, appropriately tender DVT Evaluation: No calf swelling or tenderness Extremities: no edema Skin: warm, dry; incision clean/dry/intact w/ pressure dressing in place  Recent Labs    09/10/24 0745  HGB 13.2  HCT 39.3    Assessment/Plan: Taylor Colon is a 28 y.o. H5E6986 s/p rLTCS at [redacted]w[redacted]d   POD#1  - Doing well; pain well controlled.  - Routine postpartum care - Lovenox  for VTE prophylaxis - Contraception: depo - Feeding: breast and bottle  Juvenile myoclonic epilepsy - continue Keppra  500mg  daily  Dispo: POD2  LOS: 1 day   Jerrie Gathers, DO 09/11/2024, 4:41 AM   Attestation of Supervision of Resident: Evaluation and management procedures were performed by the learners: Family Medicine Resident under my supervision. I was immediately available for direct supervision, assistance and direction throughout this encounter.  I also confirm that I have verified the information documented in the resident's note, and that I have also personally reperformed the pertinent components of the physical exam and all of the medical decision making activities.  I have also made any necessary editorial changes.  Barabara Maier,  DO FM-OB Fellow [facility] [employer]

## 2024-09-11 NOTE — Lactation Note (Signed)
 This note was copied from a baby's chart. Lactation Consultation Note  Patient Name: Taylor Colon Date: 09/11/2024 Age:30 hours  Reason for consult: Follow-up assessment;Term;Hyperbilirubinemia;Maternal endocrine disorder (double phototherapy)  P3, [redacted]w[redacted]d, 6% weight loss, double phototherapy  Follow up LC visit to see P3 mom and her baby Taylor Taylor Colon. Baby is fussy while under the lights. She was recently fed by bottle. Mother states baby was breast feeding well prior to phototherapy. Baby did not calm with pacifier or touch.   Baby brought to breast for feeding and soothing. Mother has breastfeeding experience. Baby calm, licking, and smacking her lips. Mother hand expressed colostrum while baby was attempting to latch. Baby Taylor latched well for 2 minutes, otherwise, just nuzzling. Mother bottle fed following and baby took 8 ml. Baby was placed back under the lights within 15 minutes.   Mother was started on a DEBP and fitted with 21 mm flanges. Instructed mother on the use, frequency, cleaning of breast pump and storage of breast milk. Instructed to pump every 3 hrs for 15 min until baby can feed from the breast.    Maternal Data Has patient been taught Hand Expression?: Yes  Feeding Mother's Current Feeding Choice: Breast Milk and Formula Nipple Type: Slow - flow  LATCH Score Latch: Repeated attempts needed to sustain latch, nipple held in mouth throughout feeding, stimulation needed to elicit sucking reflex.  Audible Swallowing: A few with stimulation  Type of Nipple: Everted at rest and after stimulation  Comfort (Breast/Nipple): Soft / non-tender  Hold (Positioning): Assistance needed to correctly position infant at breast and maintain latch.  LATCH Score: 7   Lactation Tools Discussed/Used Tools: Pump;Flanges Flange Size: 21 Breast pump type: Double-Electric Breast Pump;Manual (explained how the DEBP can work as a manual pump) Pump Education: Setup,  frequency, and cleaning;Milk Storage Reason for Pumping: Stimulate milk production, supplement Pumping frequency: pump every 3 hours for 15 minutes in initiate setting Pumped volume: 1 mL  Interventions Interventions: Breast feeding basics reviewed;Assisted with latch;Skin to skin;Hand express;Breast compression;Support pillows;Position options;Hand pump;DEBP;Education     Consult Status Consult Status: Follow-up Date: 09/12/24 Follow-up type: In-patient    Joshua Line M 09/11/2024, 3:50 PM

## 2024-09-11 NOTE — Social Work (Addendum)
 CSW received consult for and Edinburgh Postnatal Depression Screen score of 10.  CSW met with MOB to offer support and complete assessment. CSW entered he room ad observed MOB resting in bed and the infant tin the bassinet. CSW introduced self, CSW role and reason for visit. CSW inquired about how MOB was feeling, MOB reported good. CSW inquired abut MOB MH hx, MOB reported she has not been otically diagnosed but is currently taking Zoloft  50mg , due to undiagnosed MH symptoms marked by excessive crying when upset or for no reason and fluctuating mood during pregnancy. MOB reported she Zoloft  has been somewhat helpful. CSW encouraged MOB to remain in contact with her provider regarding her mood shifts, MOB was agreeable. CSW assessed for safety, MOB denied SI or HI. CSW provided education regarding the baby blues period vs. perinatal mood disorders, discussed treatment and gave resources for mental health follow up if concerns arise.  CSW recommends self-evaluation during the postpartum time period using the New Mom Checklist from Postpartum Progress and encouraged MOB to contact a medical professional if symptoms are noted at any time. MOB identified her mom and sister as her primary support.    CSW provided review of Sudden Infant Death Syndrome (SIDS) precautions.  MOB reported she has essential items for the infant including a bassinet, crib and car seat. MOB was agreeable to [facility] referral and scheduled an appointment for 12/23 @ 1pm.  CSW identifies no further need for intervention and no barriers to discharge at this time.  Syretta Kochel, LCSWA Clinical Social Worker [phone]

## 2024-09-12 MED ORDER — NIFEDIPINE ER OSMOTIC RELEASE 30 MG PO TB24
30.0000 mg | ORAL_TABLET | Freq: Every day | ORAL | Status: DC
  Administered 2024-09-12 – 2024-09-13 (×2): 30 mg via ORAL
  Filled 2024-09-12 (×2): qty 1

## 2024-09-12 MED ORDER — POTASSIUM CHLORIDE CRYS ER 20 MEQ PO TBCR
20.0000 meq | EXTENDED_RELEASE_TABLET | Freq: Every day | ORAL | Status: DC
  Administered 2024-09-12 – 2024-09-13 (×2): 20 meq via ORAL
  Filled 2024-09-12 (×2): qty 1

## 2024-09-12 MED ORDER — FUROSEMIDE 20 MG PO TABS
20.0000 mg | ORAL_TABLET | Freq: Every day | ORAL | Status: DC
  Administered 2024-09-12 – 2024-09-13 (×2): 20 mg via ORAL
  Filled 2024-09-12 (×2): qty 1

## 2024-09-12 NOTE — Progress Notes (Addendum)
 POSTPARTUM PROGRESS NOTE POD #2  Subjective:  Taylor Colon is a 28 y.o. H5E6986 s/p rLTCS at [redacted]w[redacted]d.  No acute events overnight.  Pt denies problems with ambulating, voiding or po intake.  She denies nausea or vomiting.  Pain is well controlled.  She has had flatus. She has not had bowel movement.  Lochia Minimal. Baby on biliblanket and light at bedside  Objective: Blood pressure 126/81, pulse 92, temperature 98.2 F (36.8 C), resp. rate 18, height 5' 2 (1.575 m), weight 123.3 kg, last menstrual period 12/05/2023, SpO2 99%, unknown if currently breastfeeding.  Physical Exam:  General: alert, cooperative and no distress Chest: no respiratory distress Heart:regular rate, distal pulses intact Abdomen: soft, nontender,  Uterine Fundus: firm, appropriately tender DVT Evaluation: No calf swelling or tenderness Extremities: no edema Skin: warm, dry; incision clean/dry/intact w/ pressure dressing in place  Recent Labs    09/10/24 0745 09/11/24 0529  HGB 13.2 11.0*  HCT 39.3 32.6*    Assessment/Plan: Taylor Colon is a 28 y.o. H5E6986 s/p rLTCS at [redacted]w[redacted]d   POD#2 - Doing well; pain well controlled.  - Routine postpartum care - Lovenox  for VTE prophylaxis - Contraception: depo - Feeding: breast and bottle  Juvenile myoclonic epilepsy - continue Keppra  500mg  daily  Dispo: POD3, baby still on lights  LOS: 2 days    Paralee JONELLE Carpen, MD Family Medicine PGY-1  09/12/2024 8:20 AM  Attestation of Supervision of Resident: Evaluation and management procedures were performed by the learners: Family Medicine Resident under my supervision. I was immediately available for direct supervision, assistance and direction throughout this encounter.  I also confirm that I have verified the information documented in the residents note, and that I have also personally reperformed the pertinent components of the physical exam and all of the medical decision making activities.  I have also made  any necessary editorial changes.  Barabara Maier, DO FM-OB Fellow [facility] [employer]

## 2024-09-12 NOTE — Patient Instructions (Addendum)
 Your appointment with Outpatient Lactation is VIRTUAL Date:09/17/2024  Time:3:30 pm --- If you are interested in an outpatient lactation consultation -- available in-office or virtually -- please reach out to us  at:  [facility] (First Floor) ?? [address], [place], [place]  ?? [phone] Please leave a message on our lactation voicemail box. We welcome any lactation-related questions or concerns -- our team is here to support you and your baby.  Lactation Support Groups Join us  at: [facility] ?? Tuesdays, 10:00 AM - 12:00 PM ?? [address], [place] [employer], [place]  Lactating parents and lap babies are welcome!  ?? [email or web address]  ?? [email or web address] -------------      Taylor Colon, IBCLC [facility]

## 2024-09-12 NOTE — Progress Notes (Signed)
 Pt past two blood pressures 133/88 and 143/85. Pt also complains of headache 5/10. Pt has no other signs or symptoms of Preeclampsia. Dr. Jomarie notified. See new orders.

## 2024-09-13 ENCOUNTER — Other Ambulatory Visit (HOSPITAL_COMMUNITY): Payer: Self-pay

## 2024-09-13 DIAGNOSIS — O165 Unspecified maternal hypertension, complicating the puerperium: Secondary | ICD-10-CM | POA: Diagnosis not present

## 2024-09-13 MED ORDER — POTASSIUM CHLORIDE CRYS ER 20 MEQ PO TBCR
20.0000 meq | EXTENDED_RELEASE_TABLET | Freq: Every day | ORAL | 0 refills | Status: AC
  Filled 2024-09-13: qty 3, 3d supply, fill #0

## 2024-09-13 MED ORDER — IBUPROFEN 600 MG PO TABS
600.0000 mg | ORAL_TABLET | Freq: Four times a day (QID) | ORAL | 0 refills | Status: AC | PRN
  Filled 2024-09-13: qty 30, 8d supply, fill #0

## 2024-09-13 MED ORDER — OXYCODONE HCL 5 MG PO TABS
5.0000 mg | ORAL_TABLET | Freq: Four times a day (QID) | ORAL | 0 refills | Status: AC | PRN
  Filled 2024-09-13: qty 20, 5d supply, fill #0

## 2024-09-13 MED ORDER — SENNOSIDES-DOCUSATE SODIUM 8.6-50 MG PO TABS
2.0000 | ORAL_TABLET | Freq: Every evening | ORAL | 0 refills | Status: AC | PRN
  Filled 2024-09-13: qty 30, 15d supply, fill #0

## 2024-09-13 MED ORDER — ACETAMINOPHEN 500 MG PO TABS
1000.0000 mg | ORAL_TABLET | Freq: Four times a day (QID) | ORAL | 0 refills | Status: AC | PRN
  Filled 2024-09-13: qty 30, 4d supply, fill #0

## 2024-09-13 MED ORDER — FUROSEMIDE 20 MG PO TABS
20.0000 mg | ORAL_TABLET | Freq: Every day | ORAL | 0 refills | Status: AC
  Filled 2024-09-13: qty 3, 3d supply, fill #0

## 2024-09-13 MED ORDER — NIFEDIPINE ER 30 MG PO TB24
30.0000 mg | ORAL_TABLET | Freq: Every day | ORAL | 0 refills | Status: AC
  Filled 2024-09-13: qty 30, 30d supply, fill #0

## 2024-09-13 NOTE — Lactation Note (Signed)
 This note was copied from a baby's chart. Lactation Consultation Note  Patient Name: Taylor Colon Unijb'd Date: 09/13/2024 Age:30 hours Reason for consult: Follow-up assessment;Hyperbilirubinemia;Term;Maternal endocrine disorder  P3. Late entry for 01:15. Mom was giving formula when LC came into rm.  Baby continues on Triple Photo Therapy. Mom looks exhausted and stated she was. Mom stated baby isn't letting her sleep and then people in and out. Encouraged mom to try get a nap in after this feeding.  Asked mom if she has been pumping any mom stated she hasn't had time. Mom asked if she should pump. LC told mom it would help her milk come in. Mom stated she will try today. Asked mom is her breast has been leaking mom stated yes. Strongly encouraged to pump to prevent engorgement.  Encouraged mom to call for assistance or questions.  Maternal Data    Feeding Mother's Current Feeding Choice: Breast Milk and Formula  LATCH Score                    Lactation Tools Discussed/Used    Interventions    Discharge Pump: DEBP  Consult Status Consult Status: Follow-up Date: 09/13/24 Follow-up type: In-patient    Taylor Colon 09/13/2024, 3:55 AM

## 2024-09-13 NOTE — Discharge Summary (Signed)
 Postpartum Discharge Summary   Patient Name: Taylor Colon DOB: [birth year] MRN: [id number]  Date of admission: 09/10/2024 Delivery date:09/10/2024 Delivering provider: BARBRA LANG PARAS Date of discharge: 09/13/2024  Admitting diagnosis: Encounter for maternal care for low transverse scar from previous cesarean delivery [O34.211] Cesarean delivery delivered [O82] Intrauterine pregnancy: [redacted]w[redacted]d     Secondary diagnosis:  Principal Problem:   Cesarean delivery delivered Active Problems:   Short interval between pregnancies affecting pregnancy, antepartum   BMI 45.0-49.9, adult (HCC)   Gestational diabetes mellitus (GDM) affecting pregnancy, antepartum   JME (juvenile myoclonic epilepsy) (HCC)   Postpartum hypertension  Additional problems:     Discharge diagnosis: Term Pregnancy Delivered, GDM A1, and PP HTN                                              Post partum procedures:none Augmentation: N/A Complications: None  Hospital course: Sceduled C/S   28 y.o. yo H5E6986 at [redacted]w[redacted]d was admitted to the hospital 09/10/2024 for scheduled cesarean section with the following indication:Elective Repeat.Delivery details are as follows:  Membrane Rupture Time/Date: 10:49 AM,09/10/2024  Delivery Method:C-Section, Low Transverse Operative Delivery:N/A Details of operation can be found in separate operative note.  Patient had a postpartum course complicated byHTN developed on POD2.  She is ambulating, tolerating a regular diet, passing flatus, and urinating well. Patient is discharged home in stable condition on  09/13/2024        Newborn Data: Birth date:09/10/2024 Birth time:10:50 AM Gender:Female Living status:Living Apgars:8 ,8  Weight:3630 g    Magnesium Sulfate received: No BMZ received: No Rhophylac:N/A MMR:N/A T-DaP:offered PP Flu: No RSV Vaccine received: No Transfusion:No  Immunizations received: Immunization History  Administered Date(s) Administered   Tdap 07/01/2017     Physical exam  Vitals:   09/12/24 1647 09/12/24 2017 09/13/24 0608 09/13/24 1146  BP: (!) 143/85 135/83 (!) 142/91 121/79  Pulse: 85 89 86 91  Resp:  19 16   Temp:  98.8 F (37.1 C) 97.6 F (36.4 C)   TempSrc:  Oral Oral   SpO2:  98% 97%   Weight:      Height:       General: alert and cooperative Lochia: appropriate Uterine Fundus: firm Incision: Healing well with no significant drainage DVT Evaluation: No significant calf/ankle edema.  Labs: Lab Results  Component Value Date   WBC 12.4 (H) 09/11/2024   HGB 11.0 (L) 09/11/2024   HCT 32.6 (L) 09/11/2024   MCV 87.9 09/11/2024   PLT 161 09/11/2024      Latest Ref Rng & Units 04/11/2024   10:57 AM  CMP  Glucose 70 - 99 mg/dL 87   BUN 6 - 20 mg/dL 5   Creatinine 9.42 - 8.99 mg/dL 9.39   Sodium 865 - 855 mmol/L 137   Potassium 3.5 - 5.2 mmol/L 4.4   Chloride 96 - 106 mmol/L 104   CO2 20 - 29 mmol/L 16   Calcium 8.7 - 10.2 mg/dL 9.3   Total Protein 6.0 - 8.5 g/dL 6.9   Total Bilirubin 0.0 - 1.2 mg/dL <9.7   Alkaline Phos 44 - 121 IU/L 66   AST 0 - 40 IU/L 11   ALT 0 - 32 IU/L 6    Edinburgh Score:    09/11/2024    5:12 AM  Edinburgh Postnatal Depression Scale Screening Tool  I have been able to laugh and see the funny side of things. 1  I have looked forward with enjoyment to things. 2  I have blamed myself unnecessarily when things went wrong. 2  I have been anxious or worried for no good reason. 0  I have felt scared or panicky for no good reason. 0  Things have been getting on top of me. 2  I have been so unhappy that I have had difficulty sleeping. 0  I have felt sad or miserable. 1  I have been so unhappy that I have been crying. 2  The thought of harming myself has occurred to me. 0  Edinburgh Postnatal Depression Scale Total 10   No data recorded  After visit meds:  Allergies as of 09/13/2024   No Known Allergies      Medication List     STOP taking these medications    Accu-Chek Guide  w/Device Kit   Accu-Chek Softclix Lancets lancets   folic acid  1 MG tablet Commonly known as: FOLVITE        TAKE these medications    Acetaminophen  Extra Strength 500 MG Tabs Take 2 tablets (1,000 mg total) by mouth every 6 (six) hours as needed for moderate pain (pain score 4-6).   furosemide  20 MG tablet Commonly known as: LASIX  Take 1 tablet (20 mg total) by mouth daily.   ibuprofen  600 MG tablet Commonly known as: ADVIL  Take 1 tablet (600 mg total) by mouth every 6 (six) hours as needed for moderate pain (pain score 4-6).   levETIRAcetam  500 MG tablet Commonly known as: KEPPRA  Take 1 tablet (500 mg total) by mouth daily.   NIFEdipine  30 MG 24 hr tablet Commonly known as: ADALAT  CC Take 1 tablet (30 mg total) by mouth daily.   oxyCODONE  5 MG immediate release tablet Commonly known as: Oxy IR/ROXICODONE  Take 1 tablet (5 mg total) by mouth every 6 (six) hours as needed for severe pain (pain score 7-10).   potassium chloride  SA 20 MEQ tablet Commonly known as: KLOR-CON  M Take 1 tablet (20 mEq total) by mouth daily.   PRENATAL VITAMINS PO Take 1 capsule by mouth daily.   sertraline  25 MG tablet Commonly known as: Zoloft  Take 2 tablets (50 mg total) by mouth daily.   Stool Softener/Laxative 50-8.6 MG tablet Generic drug: senna-docusate Take 2 tablets by mouth at bedtime as needed for mild constipation or moderate constipation.         Discharge home in stable condition Infant Feeding: Bottle and Breast Infant Disposition:rooming in Discharge instruction: per After Visit Summary and Postpartum booklet. Activity: Advance as tolerated. Pelvic rest for 6 weeks.  Diet: routine diet Future Appointments: Future Appointments  Date Time Provider Department Center  09/17/2024  1:00 PM [facility] NURSE [facility] None  10/24/2024  1:50 PM Dunn, Rollo DASEN, MD [facility] None   Follow up Visit:  Follow-up Information     [facility] Follow up.   Specialty: Obstetrics and Gynecology Contact information: [address] [place]  [address] [phone]                Message sent 12/11  Please schedule this patient for a In person postpartum visit in 6 weeks with the following provider: Any provider. Additional Postpartum F/U:2 hour GTT, Incision check 1 week, and BP check 1 week  High risk pregnancy complicated by: GDM and HTN Delivery mode:  C-Section, Low Transverse Anticipated  Birth Control:  Depo   09/13/2024 Barabara Maier, DO

## 2024-09-14 NOTE — Lactation Note (Addendum)
 This note was copied from a baby's chart. Lactation Consultation Note  Patient Name: Girl Aloha Bartok Unijb'd Date: 09/14/2024 Age:30 days Reason for consult: Follow-up assessment;Term;Maternal endocrine disorder;Hyperbilirubinemia  P3, Phototherapy has been discontinued. Baby recently consumed 50 ml of 20kcal of formula. Encouraged offering the breast and if not pumping to help mother establish her milk supply q 3 hours for 15 min. Wake baby for feedings if needed.  Feed on demand with cues.  Goal 8-12+ times per day.   Discussed how jaundice can cause infant to be sleepy.  Reviewed engorgement care and monitoring voids/stools. Suggest calling if latch assistance is needed.  Maternal Data Does the patient have breastfeeding experience prior to this delivery?: Yes  Feeding Mother's Current Feeding Choice: Breast Milk and Formula  Lactation Tools Discussed/Used Tools: Pump;Flanges Pumping frequency: q 3 hours for 15 min  Interventions Interventions: Education  Discharge Discharge Education: Engorgement and breast care;Warning signs for feeding baby Pump: Personal;Manual;DEBP  Consult Status Consult Status: Complete Date: 09/14/24   [facility] 09/14/2024, 8:03 AM

## 2024-09-17 ENCOUNTER — Ambulatory Visit

## 2024-09-17 NOTE — Progress Notes (Signed)
 Subjective:  Taylor Colon is a 28 y.o. female here for BP check and incision check.  Hypertension ROS: taking medications as instructed, no medication side effects noted, no TIA's, no chest pain on exertion, no dyspnea on exertion, and no swelling of ankles.   Pt reports incision healing well  Objective:  BP (!) 111/59 (BP Location: Left Arm, Patient Position: Sitting, Cuff Size: Large)   Pulse 91   Wt 254 lb (115.2 kg)   LMP 12/05/2023 (Exact Date)   Breastfeeding Yes   BMI 46.46 kg/m   Appearance alert, well appearing, and in no distress. Incision healing well   Assessment:   Blood Pressure today in office well controlled.  Incision healed nicely, no signs of infection, scar looks good  Plan:  Current treatment plan is effective, no change in therapy.  Cinsere Mizrahi l Arabela Basaldua, CMA

## 2024-10-17 ENCOUNTER — Encounter (HOSPITAL_COMMUNITY): Payer: Self-pay

## 2024-10-24 ENCOUNTER — Other Ambulatory Visit: Payer: Self-pay

## 2024-10-24 ENCOUNTER — Ambulatory Visit: Admitting: Obstetrics and Gynecology

## 2024-10-24 ENCOUNTER — Other Ambulatory Visit (HOSPITAL_BASED_OUTPATIENT_CLINIC_OR_DEPARTMENT_OTHER): Payer: Self-pay

## 2024-10-24 DIAGNOSIS — Z30013 Encounter for initial prescription of injectable contraceptive: Secondary | ICD-10-CM

## 2024-10-24 MED ORDER — MEDROXYPROGESTERONE ACETATE 150 MG/ML IM SUSP
150.0000 mg | INTRAMUSCULAR | 3 refills | Status: AC
  Filled 2024-10-24: qty 1, 90d supply, fill #0

## 2024-11-08 ENCOUNTER — Other Ambulatory Visit (HOSPITAL_BASED_OUTPATIENT_CLINIC_OR_DEPARTMENT_OTHER): Payer: Self-pay

## 2024-11-12 ENCOUNTER — Ambulatory Visit: Admitting: Obstetrics & Gynecology
